# Patient Record
Sex: Female | Born: 1975 | Race: White | Hispanic: No
Health system: Midwestern US, Community
[De-identification: ages and names within clinical notes are randomized; demographics above are authoritative.]

## PROBLEM LIST (undated history)

## (undated) DIAGNOSIS — N920 Excessive and frequent menstruation with regular cycle: Secondary | ICD-10-CM

## (undated) DIAGNOSIS — R102 Pelvic and perineal pain: Secondary | ICD-10-CM

## (undated) DIAGNOSIS — Z1231 Encounter for screening mammogram for malignant neoplasm of breast: Secondary | ICD-10-CM

## (undated) DIAGNOSIS — F410 Panic disorder [episodic paroxysmal anxiety] without agoraphobia: Secondary | ICD-10-CM

## (undated) DIAGNOSIS — N938 Other specified abnormal uterine and vaginal bleeding: Secondary | ICD-10-CM

## (undated) DIAGNOSIS — N939 Abnormal uterine and vaginal bleeding, unspecified: Secondary | ICD-10-CM

---

## 2011-07-21 IMAGING — CT Head^02_SINUS_LIMITED (Adult)
2 series · 15 of 40 positions shown, 18 images · non-contrast
Comparison: none

HISTORY: Nasal obstruction, lateral nasal pain status post polypectomy.

No previous for comparison.
TECHNIQUE: Routine 3 millimeter transverse images and 2 mm coronal images through the paranasal sinuses were acquired.

[Series 4: bone supine · axial · 0.32mm/px · z∈[-194,-107]mm · 12 of 35 slices shown, 15 images]
[im 3/35  brain]
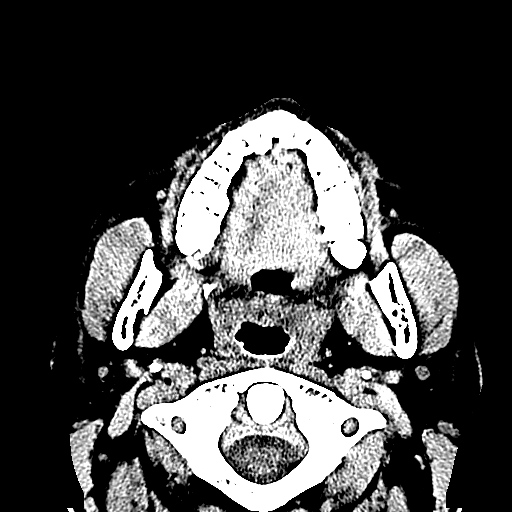
[im 3/35  bone]
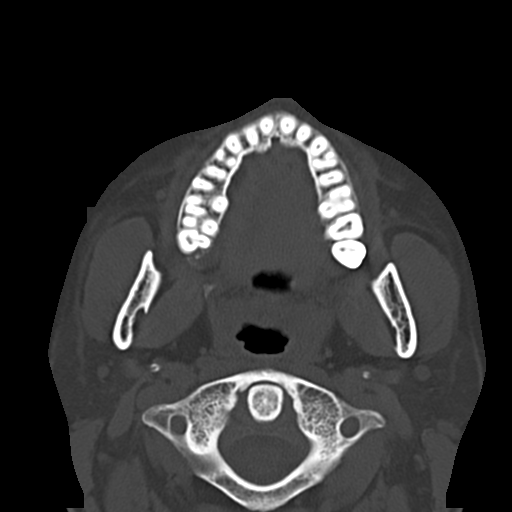
[im 5/35  bone]
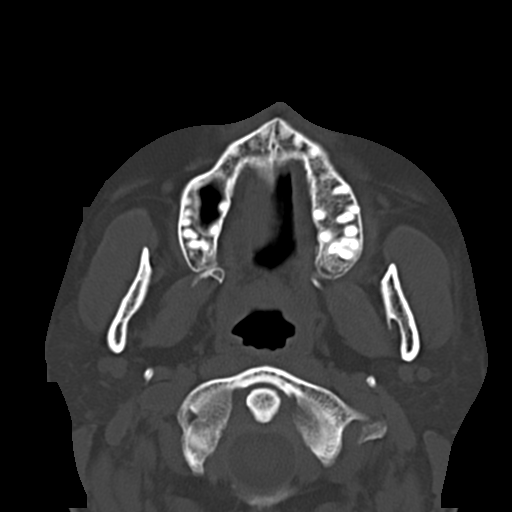
[im 8/35  bone]
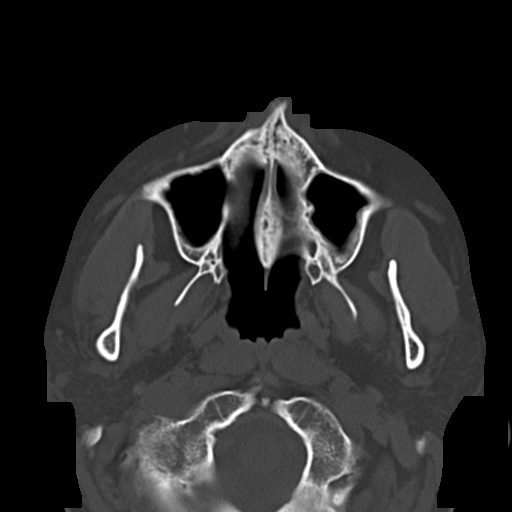
[im 11/35  bone]
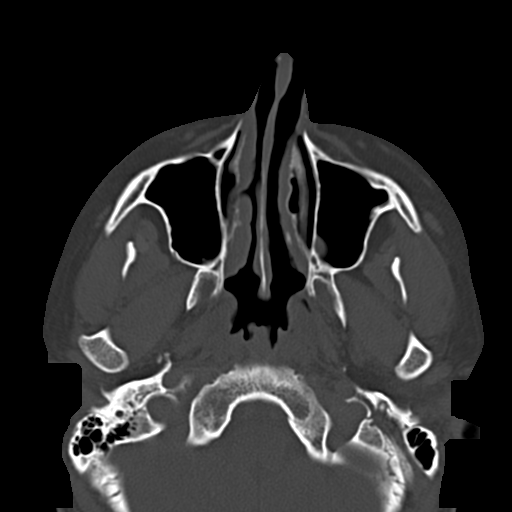
[im 13/35  brain]
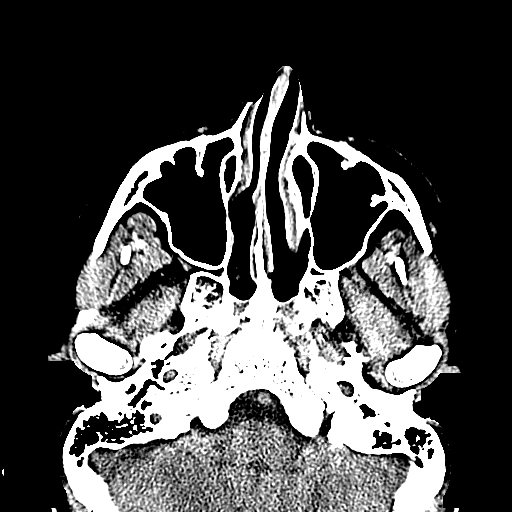
[im 13/35  bone]
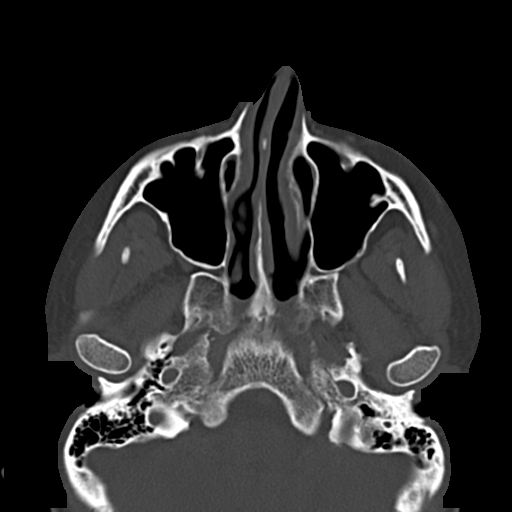
[im 16/35  bone]
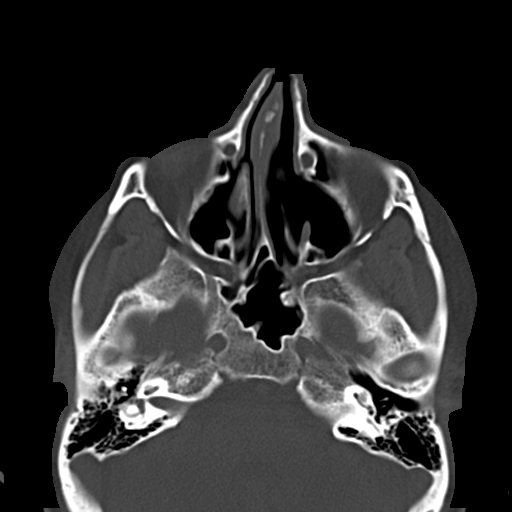
[im 19/35  bone]
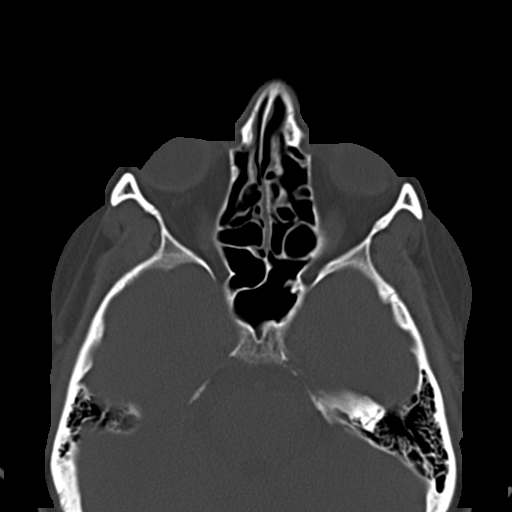
[im 22/35  bone]
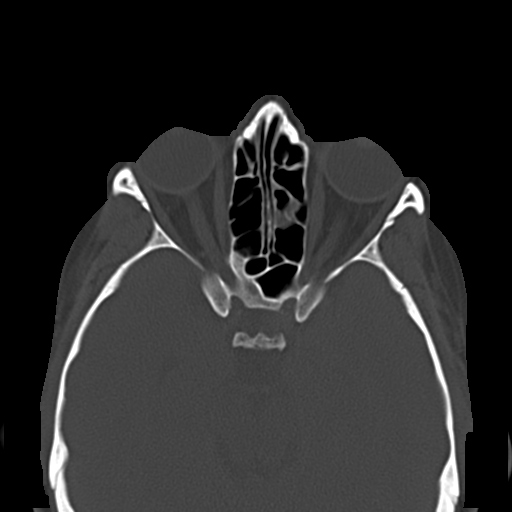
[im 24/35  brain]
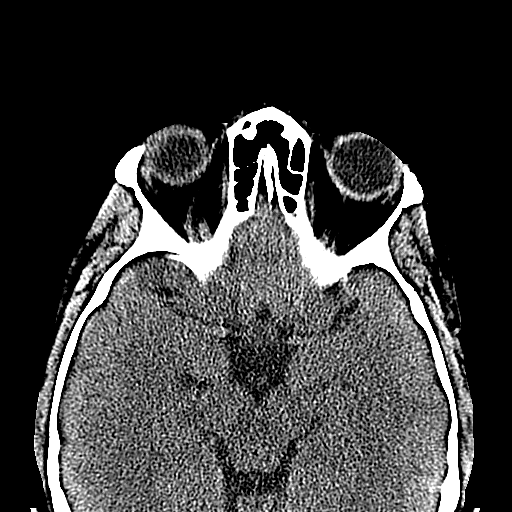
[im 24/35  bone]
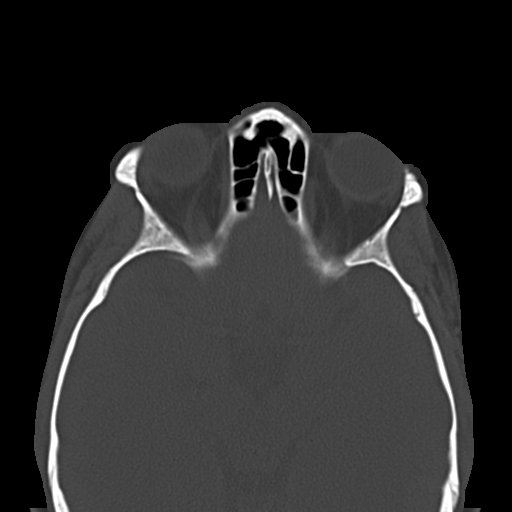
[im 27/35  bone]
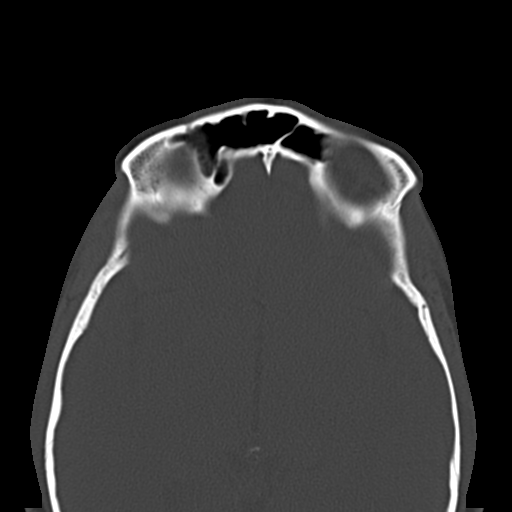
[im 30/35  bone]
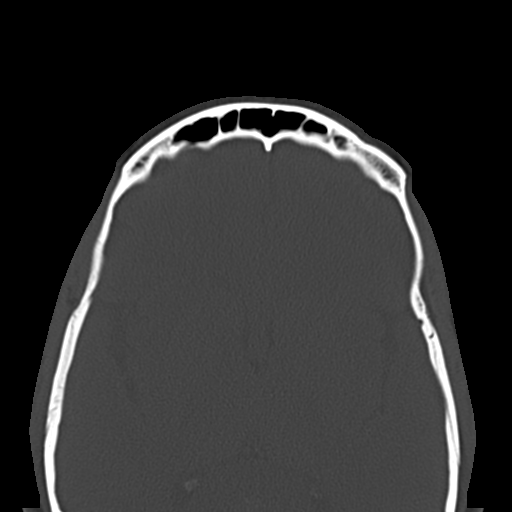
[im 32/35  bone]
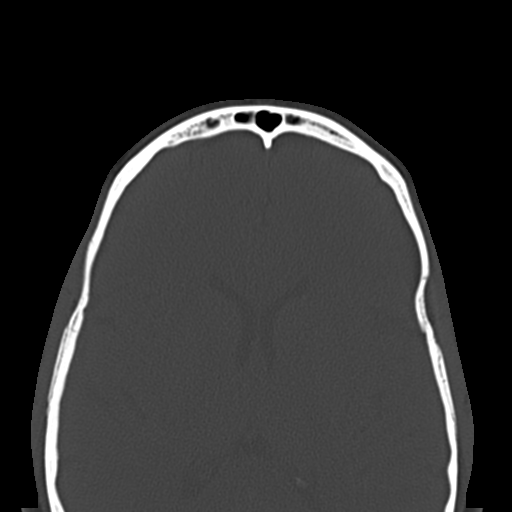

[Series 603: coronal · coronal · 0.32mm/px · 3 of 46 slices shown]
[im 16/46  bone]
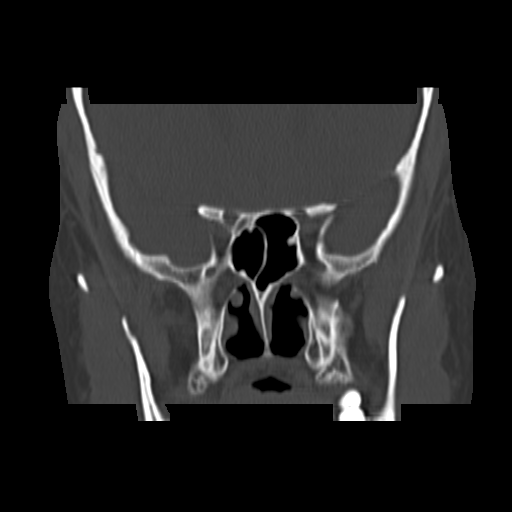
[im 21/46  bone]
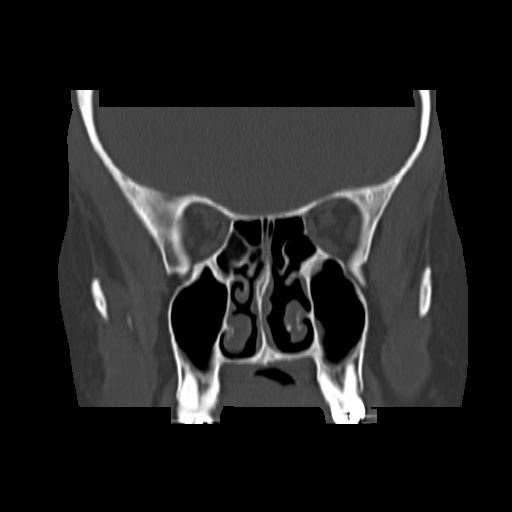
[im 26/46  bone]
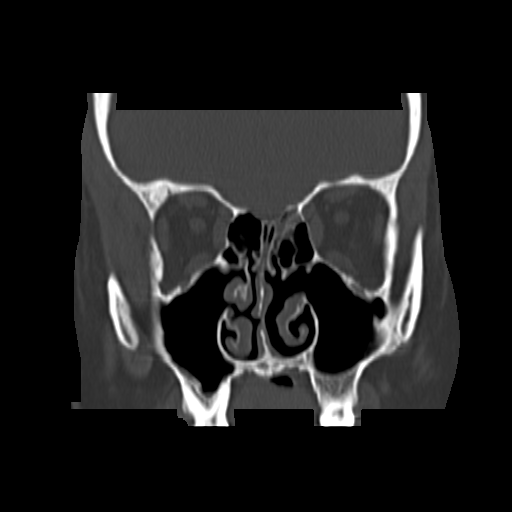

[15 of 40 positions shown; findings below may reference images not displayed]

FINDINGS: There is minor mucosal thickening present posteriorly left maxillary sinus, up to 1.7 mm.  There is also minor mucosal thickening present within the ethmoid air cells bilaterally. There are no air-fluid levels. Bilateral nasal antral windows are evident. Sphenoid sinuses and frontal sinuses are clear.

Nasal fossae demonstrate no evidence of polyposis. Resection of the left middle turbinate evident. There is mild rightward nasal septal deviation, approximately 3 mm.

Mastoid air cells and middle ear cavities are normally aerated. Osseous structures are normally mineralized without fracture or destructive lesion.
IMPRESSION: 1. Postop endoscopic sinus surgery with bilateral nasoantral windows evident. There has been partial resection of the left middle turbinate as well.

2. Minor mucosal thickening posteriorly left maxillary sinus and ethmoid air cells bilaterally consistent with mild chronic sinusitis. No air-fluid levels to suggest acute sinusitis.

3. Mild rightward nasal septal deviation.

4. No evidence of polyposis.

## 2014-06-20 NOTE — Other (Signed)
Patient verified name, DOB, and surgery as listed in [facility].     Type 1B surgery, PAT assessment complete.  Orders NOT received.    Labs per surgeon: No orders received at this time  Labs per anesthesia protocol: POC urine HCG DOS      Patient answered medical/surgical history questions at their best of ability. All prior to admission medications documented in [facility].    Patient instructed to take the following medications the day of surgery according to anesthesia guidelines with a small sip of water: none.  Hold all vitamins 7 days prior to surgery and NSAIDS 5 days prior to surgery. Medications to be held Ibuprofen    Patient instructed on the following and verbalizes understanding:  [facility], time of arrival to be called the day before by 1700  NPO after midnight including gum, mints, and ice chips  Responsible adult must drive patient to the hospital, stay during surgery, and patient will  need supervision 24 hours after anesthesia  Use HIbiclens in shower the night before surgery and on the morning of surgery  Leave all valuables(money and jewelry) at home but bring insurance card and ID on DOS  Do not wear make-up, nail polish, lotions, cologne, perfumes, powders, or oil on skin.

## 2014-06-24 ENCOUNTER — Inpatient Hospital Stay: Primary: Family Medicine

## 2014-06-25 NOTE — H&P (Signed)
[facility]                           [address]                            [place], [place] [address]                                [phone]                              HISTORY AND PHYSICAL    NAME:  April Gay, April Gay                             MR:  [id number]  LOC:  [address]            SEX:  F               ACCT:  [id number]  DOB:  Aug 28, 1975            AGE:  39              PT:  S  ADMIT:  06/27/2014          DSCH:                 MSV:        DATE OF ADMISSION: 06/27/2014.    This is a preop for surgery.    HISTORY OF PRESENT ILLNESS: April Gay is a 39 year old gravida 2, para  2 lady who is here today for her preoperative visit, she is having a  dilation and curettage, hysteroscopy with resection of uterine fibroid.  She has been seen for abnormal bleeding and noted to have a pedunculated  submucosal fibroid within the endometrium versus polyp. This measures  about 1 inch in diameter. She has had some bleeding issues, so she wishes  to have this removed.    She is otherwise in good health.    PAST SURGICAL HISTORY: C-sections x2 and had a pilonidal cyst drained.    PAST MEDICAL HISTORY: Negative.    SOCIAL HISTORY: Denies tobacco or alcohol abuse.    ALLERGIES: SHE HAS NO KNOWN DRUG ALLERGIES.    MEDICATIONS: Femcon birth control pills daily.    PHYSICAL EXAMINATION  VITAL SIGNS: The patient is afebrile, vital signs are stable.  HEAD AND NECK: Normal.  CHEST: Clear to auscultation.  HEART: Regular rate and rhythm.  ABDOMEN: Benign.  EXTREMITIES: Normal.  PELVIS: The BUS and external genitalia normal. Vagina and cervix are  normal. Pap smear is current and normal. The uterus is overall normal  size and shape as well as the ovaries.    IMPRESSION: Abnormal uterine bleeding with ultrasound showing 2.1 to 2.5  cm uterine fibroid.    PLAN: The patient to have dilation and curettage, hysteroscopy with   resection of uterine fibroid. We discussed the risks and benefits of the  surgery. Risks which include, but not limited to, infection, perforation  with excessive bleeding, which could necessitate emergent hysterectomy.                Abigail Miyamoto, MD  This is an unverified document unless signed by physician.    TID:  [id number]              DIC ID:  [id number]         DT:  06/25/2014 02:35 P  JOB:  [id number]           DOC#:  [id number]           DD:  06/25/2014     cc:  Abigail MiyamotoGlenn M Earl Zellmer, MD

## 2014-06-25 NOTE — H&P (Deleted)
[facility]                           [address]                            [place], [place] [address]                                [phone]                              HISTORY AND PHYSICAL    NAME:  April Gay, April Gay                             MR:  [id number]  LOC:  [address]            SEX:  F               ACCT:  [id number]  DOB:  04/06/1975            AGE:  39              PT:  S  ADMIT:  06/27/2014          DSCH:                 MSV:        DATE: 06/25/2014.    This is a preop for surgery.    HISTORY OF PRESENT ILLNESS: April Gay is a 39 year old gravida 2, para  2 lady who is here today for her preoperative visit, she is having a  dilation and curettage, hysteroscopy with resection of uterine fibroid.  She has been seen for abnormal bleeding and noted to have a pedunculated  submucosal fibroid within the endometrium versus polyp. This measures  about 1 inch in diameter. She has had some bleeding issues, so she wishes  to have this removed.    She is otherwise in good health.    PAST SURGICAL HISTORY: C-sections x2 and had a pilonidal cyst drained.    PAST MEDICAL HISTORY: Negative.    SOCIAL HISTORY: Denies tobacco or alcohol abuse.    ALLERGIES: SHE HAS NO KNOWN DRUG ALLERGIES.    MEDICATIONS: ________birth control pills daily.    PHYSICAL EXAMINATION  VITAL SIGNS: The patient is afebrile, vital signs are stable.  HEAD AND NECK: Normal.  CHEST: Clear to auscultation.  HEART: Regular rate and rhythm.  ABDOMEN: Benign.  EXTREMITIES: Normal.  PELVIS: The BUS and external genitalia normal. Vagina and cervix are  normal. Pap smear is current and normal. The uterus is overall normal  size and shape as well as the ovaries.    IMPRESSION: Abnormal uterine bleeding with ultrasound showing 2.1 to 2.5  cm uterine fibroid.    PLAN: The patient to have dilation and curettage, hysteroscopy with  resection of uterine fibroid. We discussed the risks and benefits of the   surgery. Risks which include, but not limited to, infection, perforation  with excessive bleeding, which could necessitate emergent hysterectomy.                Abigail Miyamoto, MD                This is an  unverified document unless signed by physician.    TID:  [id number]              DIC ID:  [id number]         DT:  06/25/2014 02:35 P  JOB:  [id number]           DOC#:  [id number]           DD:  06/25/2014     cc:  Abigail MiyamotoGlenn M Yerachmiel Spinney, MD

## 2014-06-27 ENCOUNTER — Inpatient Hospital Stay: Payer: BLUE CROSS/BLUE SHIELD

## 2014-06-27 LAB — HCG URINE, QL. - POC: Pregnancy test,urine (POC): NEGATIVE

## 2014-06-27 MED ORDER — DEXAMETHASONE SODIUM PHOSPHATE 10 MG/ML IJ SOLN
10 mg/mL | INTRAMUSCULAR | Status: AC
Start: 2014-06-27 — End: ?

## 2014-06-27 MED ORDER — PROPOFOL 10 MG/ML IV EMUL
10 mg/mL | INTRAVENOUS | Status: DC | PRN
Start: 2014-06-27 — End: 2014-06-27
  Administered 2014-06-27: 12:00:00 via INTRAVENOUS

## 2014-06-27 MED ORDER — ACETAMINOPHEN 500 MG TAB
500 mg | Freq: Four times a day (QID) | ORAL | Status: DC | PRN
Start: 2014-06-27 — End: 2014-06-27

## 2014-06-27 MED ORDER — LACTATED RINGERS IV
INTRAVENOUS | Status: DC
Start: 2014-06-27 — End: 2014-06-27
  Administered 2014-06-27 (×2): via INTRAVENOUS

## 2014-06-27 MED ORDER — FENTANYL CITRATE (PF) 50 MCG/ML IJ SOLN
50 mcg/mL | INTRAMUSCULAR | Status: AC
Start: 2014-06-27 — End: ?

## 2014-06-27 MED ORDER — HYDROCODONE-ACETAMINOPHEN 7.5 MG-325 MG TAB
ORAL_TABLET | ORAL | Status: DC
Start: 2014-06-27 — End: 2015-08-06

## 2014-06-27 MED ORDER — SODIUM CHLORIDE 0.9 % IJ SYRG
INTRAMUSCULAR | Status: DC | PRN
Start: 2014-06-27 — End: 2014-06-27

## 2014-06-27 MED ORDER — ONDANSETRON (PF) 4 MG/2 ML INJECTION
4 mg/2 mL | INTRAMUSCULAR | Status: AC
Start: 2014-06-27 — End: ?

## 2014-06-27 MED ORDER — PROPOFOL 10 MG/ML IV EMUL
10 mg/mL | INTRAVENOUS | Status: AC
Start: 2014-06-27 — End: ?

## 2014-06-27 MED ORDER — SODIUM CHLORIDE 0.9 % IJ SYRG
Freq: Three times a day (TID) | INTRAMUSCULAR | Status: DC
Start: 2014-06-27 — End: 2014-06-27

## 2014-06-27 MED ORDER — LIDOCAINE (PF) 20 MG/ML (2 %) IJ SOLN
20 mg/mL (2 %) | INTRAMUSCULAR | Status: DC | PRN
Start: 2014-06-27 — End: 2014-06-27
  Administered 2014-06-27: 12:00:00 via INTRAVENOUS

## 2014-06-27 MED ORDER — MIDAZOLAM 1 MG/ML IJ SOLN
1 mg/mL | Freq: Once | INTRAMUSCULAR | Status: AC
Start: 2014-06-27 — End: 2014-06-27
  Administered 2014-06-27: 11:00:00 via INTRAVENOUS

## 2014-06-27 MED ORDER — CEFAZOLIN 1 GRAM SOLUTION FOR INJECTION
1 gram | Freq: Once | INTRAMUSCULAR | Status: AC
Start: 2014-06-27 — End: 2014-06-27
  Administered 2014-06-27: 12:00:00 via INTRAVENOUS

## 2014-06-27 MED ORDER — HYDROMORPHONE (PF) 2 MG/ML IJ SOLN
2 mg/mL | INTRAMUSCULAR | Status: DC | PRN
Start: 2014-06-27 — End: 2014-06-27
  Administered 2014-06-27: 13:00:00 via INTRAVENOUS

## 2014-06-27 MED ORDER — EPHEDRINE SULFATE 50 MG/ML IJ SOLN
50 mg/mL | INTRAMUSCULAR | Status: DC | PRN
Start: 2014-06-27 — End: 2014-06-27
  Administered 2014-06-27: 13:00:00 via INTRAVENOUS

## 2014-06-27 MED ORDER — EPHEDRINE SULFATE 50 MG/ML IJ SOLN
50 mg/mL | INTRAMUSCULAR | Status: AC
Start: 2014-06-27 — End: ?

## 2014-06-27 MED ORDER — OXYCODONE 5 MG TAB
5 mg | ORAL | Status: DC | PRN
Start: 2014-06-27 — End: 2014-06-27

## 2014-06-27 MED ORDER — PROMETHAZINE 25 MG/ML INJECTION
25 mg/mL | INTRAMUSCULAR | Status: DC | PRN
Start: 2014-06-27 — End: 2014-06-27

## 2014-06-27 MED ORDER — DEXAMETHASONE SODIUM PHOSPHATE 10 MG/ML IJ SOLN
10 mg/mL | INTRAMUSCULAR | Status: DC | PRN
Start: 2014-06-27 — End: 2014-06-27
  Administered 2014-06-27: 12:00:00 via INTRAVENOUS

## 2014-06-27 MED ORDER — LACTATED RINGERS IV
INTRAVENOUS | Status: DC
Start: 2014-06-27 — End: 2014-06-27

## 2014-06-27 MED ORDER — FAMOTIDINE 20 MG TAB
20 mg | Freq: Once | ORAL | Status: AC
Start: 2014-06-27 — End: 2014-06-27
  Administered 2014-06-27: 11:00:00 via ORAL

## 2014-06-27 MED ORDER — SODIUM CHLORIDE 0.9 % IV
INTRAVENOUS | Status: DC
Start: 2014-06-27 — End: 2014-06-27

## 2014-06-27 MED ORDER — FENTANYL CITRATE (PF) 50 MCG/ML IJ SOLN
50 mcg/mL | INTRAMUSCULAR | Status: DC | PRN
Start: 2014-06-27 — End: 2014-06-27
  Administered 2014-06-27 (×4): via INTRAVENOUS

## 2014-06-27 MED ORDER — LIDOCAINE (PF) 20 MG/ML (2 %) IV SYRINGE
100 mg/5 mL (2 %) | INTRAVENOUS | Status: AC
Start: 2014-06-27 — End: ?

## 2014-06-27 MED ORDER — CEFAZOLIN 1 GRAM SOLUTION FOR INJECTION
1 gram | Freq: Once | INTRAMUSCULAR | Status: DC
Start: 2014-06-27 — End: 2014-06-27

## 2014-06-27 MED ORDER — LIDOCAINE HCL 1 % (10 MG/ML) IJ SOLN
10 mg/mL (1 %) | INTRAMUSCULAR | Status: DC | PRN
Start: 2014-06-27 — End: 2014-06-27
  Administered 2014-06-27: 11:00:00 via SUBCUTANEOUS

## 2014-06-27 MED ORDER — FENTANYL CITRATE (PF) 50 MCG/ML IJ SOLN
50 mcg/mL | Freq: Once | INTRAMUSCULAR | Status: DC
Start: 2014-06-27 — End: 2014-06-27

## 2014-06-27 MED FILL — PROPOFOL 10 MG/ML IV EMUL: 10 mg/mL | INTRAVENOUS | Qty: 200

## 2014-06-27 MED FILL — SODIUM CHLORIDE 0.9 % IV PIGGY BACK: INTRAVENOUS | Qty: 50

## 2014-06-27 MED FILL — FENTANYL CITRATE (PF) 50 MCG/ML IJ SOLN: 50 mcg/mL | INTRAMUSCULAR | Qty: 2

## 2014-06-27 MED FILL — MIDAZOLAM 1 MG/ML IJ SOLN: 1 mg/mL | INTRAMUSCULAR | Qty: 2

## 2014-06-27 MED FILL — LIDOCAINE (PF) 20 MG/ML (2 %) IJ SOLN: 20 mg/mL (2 %) | INTRAMUSCULAR | Qty: 100

## 2014-06-27 MED FILL — DEXAMETHASONE SODIUM PHOSPHATE 10 MG/ML IJ SOLN: 10 mg/mL | INTRAMUSCULAR | Qty: 10

## 2014-06-27 MED FILL — LIDOCAINE (PF) 20 MG/ML (2 %) IV SYRINGE: 100 mg/5 mL (2 %) | INTRAVENOUS | Qty: 5

## 2014-06-27 MED FILL — EPHEDRINE SULFATE 50 MG/ML IJ SOLN: 50 mg/mL | INTRAMUSCULAR | Qty: 10

## 2014-06-27 MED FILL — ONDANSETRON (PF) 4 MG/2 ML INJECTION: 4 mg/2 mL | INTRAMUSCULAR | Qty: 2

## 2014-06-27 MED FILL — EPHEDRINE SULFATE 50 MG/ML IJ SOLN: 50 mg/mL | INTRAMUSCULAR | Qty: 1

## 2014-06-27 MED FILL — PROPOFOL 10 MG/ML IV EMUL: 10 mg/mL | INTRAVENOUS | Qty: 20

## 2014-06-27 MED FILL — HYDROMORPHONE (PF) 2 MG/ML IJ SOLN: 2 mg/mL | INTRAMUSCULAR | Qty: 1

## 2014-06-27 MED FILL — FAMOTIDINE 20 MG TAB: 20 mg | ORAL | Qty: 1

## 2014-06-27 MED FILL — DEXAMETHASONE SODIUM PHOSPHATE 10 MG/ML IJ SOLN: 10 mg/mL | INTRAMUSCULAR | Qty: 1

## 2014-06-27 NOTE — Other (Signed)
Peri pad drainage remains unchanged.

## 2014-06-27 NOTE — Brief Op Note (Signed)
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

## 2014-06-27 NOTE — Op Note (Signed)
[facility]                           [address]                            [place], [place] [id number]                                [phone]                                OPERATIVE REPORT  ________________________________________________________________________  NAME:  April Gay, [name]                             MR:  [id number]  LOC:  PACUPACUPL            SEX:  F               ACCT:  [id number]  DOB:  02/14/1976            AGE:  39              PT:  S  ADMIT:  06/27/2014          DSCH:  06/27/2014     MSV:  ________________________________________________________________________        DATE OF SURGERY: 06/27/2014    PREOPERATIVE DIAGNOSIS: The patient with abnormal bleeding, ultrasound  confirmed approximately 1 inch in diameter uterine fibroid.    POSTOPERATIVE DIAGNOSIS    NAME OF PROCEDURE  1. Dilatation and curettage.  2. Hysteroscopic resection of uterine fibroid.    SURGEON: Shawnie DapperGlenn M. JamaicaFrench, MD.    ANESTHESIA: General.    ESTIMATED BLOOD LOSS: 10 mL.    COMPLICATIONS: None.    TISSUE: Uterine fibroid.    PROCEDURE: After informed consent, the patient was taken to the operating  room and given general anesthesia. She was prepped and draped in the  usual sterile fashion in dorsal lithotomy position. Examination under  anesthesia was carried out. The weighted speculum was placed in the  vagina. Tenaculum was used to grasp the anterior lip of the cervix. The  cervix was dilated to admit the hysteroscope. The uterine fibroid that  was known to exist due to ultrasound, was located anteriorly, somewhat  pedunculated, but also some of the fibroid did extend into the  myometrium. This was also fundal as well. It decided to use the MyoSure  for morcellation. The MyoSure hysteroscope was then placed into the  uterine cavity, pictures taken before and after morcellation. The  MyoSure was then used to gradually take the fibroid away in pieces. The   intake and output volume was constantly monitored. The fibroid was  removed as much as it could safely be removed in that the portion that  was submucosal was extracted. There still was a small amount of base  tissue that was present in the myometrium, and the pressure was dropped  and then some of this does extrude and some more of it was taken down.  The total volume of fluid loss was about 1800 mL and some of this  was unquantifiable due to leakage and was lost on the floor. This number  was artificially increased. A true measure would be  around 1500-1600 mL.  At this time, the procedure was terminated as the instruments were  removed, again, a picture was taken after. The patient was then awakened  and taken to the recovery room in stable condition. She was given  discharge instructions and given Lortab pain pills. She will come back in  the office in 1 week for followup visit.                Abigail MiyamotoGlenn M Christiana Gurevich, MD                This is an unverified document unless signed by physician.    TID:  wmx                                      DT:  06/27/2014 11:17 A  JOB:  [id number]           DOC#:  [id number]           DD:  06/27/2014    cc:   Abigail MiyamotoGlenn M Vitaly Wanat, MD

## 2014-06-27 NOTE — Anesthesia Post-Procedure Evaluation (Signed)
Post-Anesthesia Evaluation and Assessment    Patient: April PedCindy P Flood MRN: [id number]  SSN: [id number]    Date of Birth: 1975/12/15  Age: 39 y.o.  Sex: female       Cardiovascular Function/Vital Signs  Visit Vitals   Item Reading   ??? BP 123/69 mmHg   ??? Pulse 70   ??? Temp 36.7 ??C (98.1 ??F)   ??? Resp 16   ??? Ht 5\' 3"  (1.6 m)   ??? Wt 61.803 kg (136 lb 4 oz)   ??? BMI 24.14 kg/m2   ??? SpO2 98%       Patient is status post general anesthesia for Procedure(s):  DILATATION AND CURETTAGE HYSTEROSCOPY  VAGINAL MYOMECTOMY/ MYOSURE.    Nausea/Vomiting: None    Postoperative hydration reviewed and adequate.    Pain:  Pain Scale 1: Numeric (0 - 10) (06/27/14 0853)  Pain Intensity 1: 0 (06/27/14 0853)   Managed    Neurological Status:   Neuro (WDL): Within Defined Limits (06/27/14 0853)  Neuro  LUE Motor Response: Purposeful (06/27/14 0853)  LLE Motor Response: Purposeful (06/27/14 0853)  RUE Motor Response: Purposeful (06/27/14 0853)  RLE Motor Response: Purposeful (06/27/14 0853)   At baseline    Mental Status and Level of Consciousness: Alert and oriented     Pulmonary Status:   O2 Device: Room air (06/27/14 0908)   Adequate oxygenation and airway patent    Complications related to anesthesia: None    Post-anesthesia assessment completed. No concerns    Signed By: Arlis PortaENNIS E Kenton Leppla, MD     June 27, 2014

## 2014-06-27 NOTE — Other (Signed)
Peri-pad with scant amount of bloody drainage noted.

## 2014-06-27 NOTE — Anesthesia Pre-Procedure Evaluation (Addendum)
Anesthetic History   No history of anesthetic complications            Review of Systems / Medical History  Patient summary reviewed, nursing notes reviewed and pertinent labs reviewed    Pulmonary          Smoker         Neuro/Psych   Within defined limits           Cardiovascular                  Exercise tolerance: >4 METS     GI/Hepatic/Renal  Within defined limits              Endo/Other  Within defined limits           Other Findings            Physical Exam    Airway  Mallampati: II  TM Distance: 4 - 6 cm  Neck ROM: normal range of motion   Mouth opening: Normal     Cardiovascular  Regular rate and rhythm,  S1 and S2 normal,  no murmur, click, rub, or gallop  Rhythm: regular  Rate: normal         Dental    Dentition: Caps/crowns     Pulmonary  Breath sounds clear to auscultation               Abdominal         Other Findings            Anesthetic Plan    ASA: 2  Anesthesia type: general          Induction: Intravenous  Anesthetic plan and risks discussed with: Patient

## 2014-06-27 NOTE — Op Note (Signed)
[facility]                           [address]                            [place], [place] [address]                                [phone]                                OPERATIVE REPORT  ________________________________________________________________________  NAME:  April Gay, April Gay                             MR:  [id number]  LOC:  PACUPACUPL            SEX:  F               ACCT:  [id number]  DOB:  Jun 11, 1975            AGE:  39              PT:  S  ADMIT:  06/27/2014          DSCH:  06/27/2014     MSV:  ________________________________________________________________________        DATE OF SURGERY: 06/27/2014    PREOPERATIVE DIAGNOSIS: The patient with abnormal bleeding, ultrasound  confirmed approximately 1 inch in diameter uterine fibroid.    POSTOPERATIVE DIAGNOSIS    NAME OF PROCEDURE  1. Dilatation and curettage.  2. Hysteroscopic resection of uterine fibroid.    SURGEON: Shawnie Dapper. [place], MD.    ANESTHESIA: General.    ESTIMATED BLOOD LOSS: 10 mL.    COMPLICATIONS: None.    TISSUE: Uterine fibroid.    PROCEDURE: After informed consent, the patient was taken to the operating  room and given general anesthesia. She was prepped and draped in the  usual sterile fashion in dorsal lithotomy position. Examination under  anesthesia was carried out. The weighted speculum was placed in the  vagina. Tenaculum was used to grasp the anterior lip of the cervix. The  cervix was dilated to admit the hysteroscope. The uterine fibroid that  was known to exist due to ultrasound, was located anteriorly, somewhat  pedunculated, but also some of the fibroid did extend into the  myometrium. This was also fundal as well. It decided to use the MyoSure  for morcellation. The MyoSure hysteroscope was then placed into the  uterine cavity, pictures taken before and after morcellation. The  MyoSure was then used to gradually take the fibroid away in pieces. The  intake and output  volume was constantly monitored. The fibroid was  removed as much as it could safely be removed in that the portion that  was submucosal was extracted. There still was a small amount of base  tissue that was present in the myometrium, and the pressure was dropped  and then some of this does extrude and some more of it was taken down.  The total volume of fluid loss was about 1800 mL and some of this  was unquantifiable due to leakage and was lost on the floor. This number  was artificially increased. A true measure would be  around 1500-1600 mL.  At this time, the procedure was terminated as the instruments were  removed, again, a picture was taken after. The patient was then awakened  and taken to the recovery room in stable condition. She was given  discharge instructions and given Lortab pain pills. She will come back in  the office in 1 week for followup visit.                Abigail MiyamotoGlenn M Christiana Gurevich, MD                This is an unverified document unless signed by physician.    TID:  wmx                                      DT:  06/27/2014 11:17 A  JOB:  [id number]           DOC#:  [id number]           DD:  06/27/2014    cc:   Abigail MiyamotoGlenn M Vitaly Wanat, MD

## 2015-04-06 ENCOUNTER — Encounter

## 2015-04-07 ENCOUNTER — Ambulatory Visit
Admit: 2015-04-07 | Discharge: 2015-04-07 | Payer: BLUE CROSS/BLUE SHIELD | Attending: Obstetrics & Gynecology | Primary: Family Medicine

## 2015-04-07 DIAGNOSIS — Z124 Encounter for screening for malignant neoplasm of cervix: Secondary | ICD-10-CM

## 2015-04-07 NOTE — Progress Notes (Signed)
Pt came in today for repeat pap but we discussed other things such as dyspareunia and AUB. States she avoids intercourse due to pain and also bleeding gets in the way. Bleeds 7-10 days, 24-26 day cycle. She is already on BCPs, these are not adequate. They do not want any more children. Exam shows abd benign, BUS/EG wnl . Vagina and cervix wnl, pap done. Uterus definitely 8 weeks with anterior fibroid/adenoma, tender on exam. Pt having dysmenorrhea so perhaps has endometriosis. Will cal  With pap results. Discussed hysterectomy for permanent birth control, AUB, pain and relief of abnormal paps. 15-20 minutes.

## 2015-04-12 LAB — PAP IG,CT-NG, APTIMA HPV AND RFX 16/18,45 (183194,507805)
.: 0
Chlamydia, Nuc. Acid Amp.: NEGATIVE
Gonococcus, Nuc. Acid Amp.: NEGATIVE
HPV APTIMA: NEGATIVE

## 2015-04-12 LAB — PAP IG,CT-NG, APTIMA HPV AND RFX 16/18,45 (199310)
CHLAMYDIA, NUC. ACID AMP, 186114: NEGATIVE
GONOCOCCUS, NUC. ACID AMP, 186122: NEGATIVE
HPV Aptima: NEGATIVE
LABCORP 019018: 0

## 2015-05-20 ENCOUNTER — Inpatient Hospital Stay: Admit: 2015-05-20 | Payer: BLUE CROSS/BLUE SHIELD | Attending: Obstetrics & Gynecology | Primary: Family Medicine

## 2015-05-20 DIAGNOSIS — Z1231 Encounter for screening mammogram for malignant neoplasm of breast: Secondary | ICD-10-CM

## 2015-08-06 ENCOUNTER — Ambulatory Visit
Admit: 2015-08-06 | Discharge: 2015-08-06 | Payer: BLUE CROSS/BLUE SHIELD | Attending: Obstetrics & Gynecology | Primary: Family Medicine

## 2015-08-06 DIAGNOSIS — Z01419 Encounter for gynecological examination (general) (routine) without abnormal findings: Secondary | ICD-10-CM

## 2015-08-06 LAB — AMB POC URINALYSIS DIP STICK AUTO W/O MICRO
Bilirubin (UA POC): NEGATIVE
Blood (UA POC): NEGATIVE
Glucose (UA POC): NEGATIVE
Ketones (UA POC): NEGATIVE
Leukocyte esterase (UA POC): NEGATIVE
Nitrites (UA POC): NEGATIVE
Protein (UA POC): NEGATIVE mg/dL
Specific gravity (UA POC): 1.015 (ref 1.001–1.035)
Urobilinogen (UA POC): 0.2 (ref 0.2–1)
pH (UA POC): 5.5 (ref 4.6–8.0)

## 2015-08-06 LAB — AMB POC TOTAL HEMOGLOBIN (SPHB): Total Hemoglobin (SpHb)(POC): 13.4 g/dL

## 2015-08-06 MED ORDER — NORETHIN-ETHINYL ESTRADIOL-IRON 0.4 MG-35 MCG(21)/75 MG(7) CHEW TABLET
PACK | Freq: Every day | ORAL | 13 refills | Status: DC
Start: 2015-08-06 — End: 2016-08-09

## 2015-08-06 NOTE — Progress Notes (Signed)
HISTORY OF PRESENT ILLNESS  April Gay is a 40 y.o. female.  Well Woman   The history is provided by the patient. Pertinent negatives include no chest pain and no headaches.       Review of Systems   Constitutional: Negative.  Negative for chills, fever, malaise/fatigue and weight loss.   HENT: Negative for congestion, ear discharge and sore throat.    Respiratory: Negative.    Cardiovascular: Negative for chest pain, palpitations and leg swelling.   Gastrointestinal: Negative.    Genitourinary: Negative.    Musculoskeletal: Negative for back pain and myalgias.   Skin: Negative.  Negative for itching and rash.   Neurological: Negative for dizziness, speech change, focal weakness, weakness and headaches.   Endo/Heme/Allergies: Does not bruise/bleed easily.   Psychiatric/Behavioral: Negative for depression and memory loss. The patient does not have insomnia.        Physical Exam   Constitutional: She is oriented to person, place, and time. She appears well-developed and well-nourished.   HENT:   Head: Normocephalic.   Mouth/Throat: No oropharyngeal exudate.   Eyes: Right eye exhibits no discharge. Left eye exhibits no discharge.   Neck: Normal range of motion. Neck supple. No tracheal deviation present. No thyromegaly present.   Cardiovascular: Normal rate, regular rhythm, normal heart sounds and intact distal pulses.  Exam reveals no gallop and no friction rub.    No murmur heard.  Pulmonary/Chest: Effort normal and breath sounds normal. No respiratory distress. She has no wheezes. She has no rales.   Abdominal: She exhibits no distension and no mass. There is no tenderness. There is no rebound and no guarding.   Genitourinary: Vagina normal and uterus normal. Rectal exam shows no external hemorrhoid, no internal hemorrhoid, no fissure, no mass, no tenderness, anal tone normal and guaiac negative stool. No breast swelling, tenderness, discharge or bleeding. There is no rash, tenderness,  lesion or injury on the right labia. There is no rash, tenderness, lesion or injury on the left labia. Uterus is not deviated, not enlarged and not tender. Cervix exhibits no motion tenderness and no discharge. Right adnexum displays no mass, no tenderness and no fullness. Left adnexum displays no tenderness and no fullness. No bleeding in the vagina. No foreign body in the vagina. No vaginal discharge found.   Genitourinary Comments: No pap due   Musculoskeletal: Normal range of motion. She exhibits no edema or deformity.   Lymphadenopathy:     She has no cervical adenopathy.   Neurological: She is alert and oriented to person, place, and time. No cranial nerve deficit. Coordination normal.   Skin: Skin is warm and dry. No rash noted. No erythema. No pallor.   Psychiatric: She has a normal mood and affect. Her behavior is normal. Judgment and thought content normal.   refill for BCPs, mammo order    ASSESSMENT and PLAN  Orders Placed This Encounter   ??? Bilateral (Routine screening, yearly, no symptoms)   ??? Transcutaneous HgB ([address])   ??? Urinalysis, Auto w/o Micro (81003)   ??? DISCONTD: Noreth-Ethinyl Estradiol-Iron (ZENCHENT FE) 0.4mg -86mcg(21) and 75 mg (7) chew   ??? Noreth-Ethinyl Estradiol-Iron (ZENCHENT FE) 0.4mg -71mcg(21) and 75 mg (7) chew     Orders Placed This Encounter   ??? Bilateral (Routine screening, yearly, no symptoms)     Standing Status:   Future     Standing Expiration Date:   09/05/2016     Order Specific Question:   Reason for Exam  Answer:   Screening Mammogram     Order Specific Question:   Is Patient Pregnant?     Answer:   No   ??? Transcutaneous HgB ([address](887[address])   ??? Urinalysis, Auto w/o Micro (81003)   ??? Noreth-Ethinyl Estradiol-Iron (ZENCHENT FE) 0.4mg -7635mcg(21) and 75 mg (7) chew     Sig: Take 1 Tab by mouth daily. Indications: DYSMENORRHEA     Dispense:  1 Package     Refill:  13     Orders Placed This Encounter   ??? Bilateral (Routine screening, yearly, no symptoms)    ??? Transcutaneous HgB (60454(88738)   ??? Urinalysis, Auto w/o Micro (81003)   ??? Noreth-Ethinyl Estradiol-Iron (ZENCHENT FE) 0.4mg -4535mcg(21) and 75 mg (7) chew     [name] was seen today for well woman.    Diagnoses and all orders for this visit:    Well woman exam  -     Transcutaneous HgB (09811(88738)  -     Urinalysis, Auto w/o Micro (81003)    Screening for iron deficiency anemia  -     Transcutaneous HgB (91478(88738)    Screening for genitourinary condition  -     Urinalysis, Auto w/o Micro (81003)    Visit for screening mammogram  -     Bilateral (Routine screening, yearly, no symptoms); Future    Other orders  -     Noreth-Ethinyl Estradiol-Iron (ZENCHENT FE) 0.4mg -435mcg(21) and 75 mg (7) chew; Take 1 Tab by mouth daily. Indications: DYSMENORRHEA      current treatment plan is effective, no change in therapy

## 2015-11-19 ENCOUNTER — Ambulatory Visit
Admit: 2015-11-19 | Discharge: 2015-11-19 | Payer: BLUE CROSS/BLUE SHIELD | Attending: Family Medicine | Primary: Family Medicine

## 2015-11-19 DIAGNOSIS — F418 Other specified anxiety disorders: Secondary | ICD-10-CM

## 2015-11-19 MED ORDER — ALPRAZOLAM 0.5 MG TAB
0.5 mg | ORAL_TABLET | Freq: Three times a day (TID) | ORAL | 1 refills | Status: DC | PRN
Start: 2015-11-19 — End: 2016-01-19

## 2015-11-19 MED ORDER — ESCITALOPRAM 10 MG TAB
10 mg | ORAL_TABLET | Freq: Every day | ORAL | 1 refills | Status: DC
Start: 2015-11-19 — End: 2016-01-19

## 2015-11-19 NOTE — Assessment & Plan Note (Signed)
Patient is here to establish care as a new patient with family medicine provider as patient's last primary care physician specialized in internal medicine.  Not Stable: discussed with pt various Tx options including different classes of medications &/or counseling. Will start with Lexapro 10 mg every day & referral to Psychology for counseling placed. Will f/u in 2 months.

## 2015-11-19 NOTE — Assessment & Plan Note (Signed)
Between 3 and 10 minutes spent advising patient regarding the deleterious effects of tobacco use including increased risk of: stroke, heart attack, high blood pressure, cancer, etc. Patient voices understanding of risks and acknowledges it would be better if the patient avoids further tobacco exposure. Discussed with patient possible options to help with tobacco cessation including: nicotine gum/patches/vapor and medication options.

## 2015-11-19 NOTE — Assessment & Plan Note (Signed)
Problem not addressed today but diagnosis added to order future lab.

## 2015-11-19 NOTE — Assessment & Plan Note (Signed)
Patient is here to establish care as a new patient with family medicine provider as patient's last primary care physician specialized in internal medicine.  Not Stable: providing low dose of Xanax to be used PRN and also at bedtime to help with insomnia as stress/anxiety is likely the main cause of this new issue

## 2015-11-19 NOTE — Patient Instructions (Signed)
Continue taking all the medications on this list as directed; this list is current and please discontinue any medications not on this list. Please call the office or use "My Chart" online to request medication refills prior to running out.  It was a pleasure to see and care for you this visit; I look forward to seeing you next time.

## 2015-11-19 NOTE — Progress Notes (Signed)
Kermit Balo MD  [facility]  [address]  [place], [place] [address]  [phone]  Visit Date  11/19/2015    Patient Info  April Gay  40 y.o.female  DOB: 1975-05-09  MRN: [id number]    Subjective    Chief Complaint   Patient presents with   ??? Establish Care   ??? Anxiety     C/o anxiety, stress at work and personal issues at home x 1 mo   ??? Fatigue     C/o fatiuge x mo's       HPI 40 year old Caucasian female here to establish care as new patient.  Patient has no chronic health problems she is aware of but states that she is here for concern of worsening symptoms of depression with anxiety and frequent panic attacks been progressively worsening for the past 6+ months especially over the last month.  Patient attributes these new developments to worsening problems with stress at work as well as significant stress at home as her husband has been unable to work due to accident which injured his lower back.  Her symptoms include: Depressed mood most days, anhedonia, insomnia, fatigue, irritability, perseverating thoughts, and recurrent panic attacks on an almost daily basis.    Review of Systems   Constitutional: Positive for fatigue. Negative for fever.   HENT: Negative for congestion and sore throat.    Eyes: Negative for pain and visual disturbance.   Respiratory: Negative for cough and shortness of breath.    Cardiovascular: Negative for chest pain and palpitations.   Gastrointestinal: Negative for abdominal pain, constipation, diarrhea and nausea.   Endocrine: Negative for polydipsia and polyphagia.   Genitourinary: Negative for difficulty urinating and menstrual problem.   Musculoskeletal: Negative for arthralgias and myalgias.   Skin: Negative for color change and rash.   Allergic/Immunologic: Negative for environmental allergies and food allergies.   Neurological: Negative for weakness and headaches.   Hematological: Does not bruise/bleed easily.    Psychiatric/Behavioral: Positive for agitation, decreased concentration, dysphoric mood and sleep disturbance. Negative for confusion, hallucinations and suicidal ideas. The patient is nervous/anxious.        Objective    Vitals:    11/19/15 1041   BP: 112/72   Pulse: 82   SpO2: 98%   Weight: 119 lb (54 kg)   Height: 5\' 3"  (1.6 m)     BP Readings from Last 3 Encounters:   11/19/15 112/72   08/06/15 100/60   04/07/15 102/68     Body mass index is 21.08 kg/(m^2).    Wt Readings from Last 3 Encounters:   11/19/15 119 lb (54 kg)   08/06/15 125 lb (56.7 kg)   04/07/15 120 lb (54.4 kg)       Physical Exam   Constitutional: She appears well-developed and well-nourished.   HENT:   Head: Normocephalic and atraumatic.   Eyes: Conjunctivae and EOM are normal.   Neck: Normal range of motion and phonation normal.   Cardiovascular: Normal rate, regular rhythm, normal heart sounds and intact distal pulses.    Pulmonary/Chest: Effort normal and breath sounds normal. No respiratory distress.   Abdominal: Soft. Normal appearance.   Musculoskeletal: She exhibits no edema or deformity.   Neurological: She is alert. She displays no tremor. Coordination and gait normal.   Skin: Skin is dry. No rash noted.   Psychiatric: Her behavior is normal. Her mood appears anxious.   Vitals reviewed.      Assessment/Plan    Problem  List Items Addressed This Visit     Tobacco use disorder      Between 3 and 10 minutes spent advising patient regarding the deleterious effects of tobacco use including increased risk of: stroke, heart attack, high blood pressure, cancer, etc. Patient voices understanding of risks and acknowledges it would be better if the patient avoids further tobacco exposure. Discussed with patient possible options to help with tobacco cessation including: nicotine gum/patches/vapor and medication options.               Relevant Medications    escitalopram oxalate (LEXAPRO) 10 mg tablet    ALPRAZolam (XANAX) 0.5 mg tablet     Other Relevant Orders    PR SMOKING AND TOBACCO USE CESSATION 3 - 10 MINUTES    AMB POC COMPLETE CBC,AUTOMATED ENTER    Panic attacks      Patient is here to establish care as a new patient with family medicine provider as patient's last primary care physician specialized in internal medicine.  Not Stable: providing low dose of Xanax to be used PRN and also at bedtime to help with insomnia as stress/anxiety is likely the main cause of this new issue             Relevant Orders    REFERRAL TO PSYCHOLOGY    Depression with anxiety - Primary      Patient is here to establish care as a new patient with family medicine provider as patient's last primary care physician specialized in internal medicine.  Not Stable: discussed with pt various Tx options including different classes of medications &/or counseling. Will start with Lexapro 10 mg every day & referral to Psychology for counseling placed. Will f/u in 2 months.             Relevant Medications    escitalopram oxalate (LEXAPRO) 10 mg tablet    ALPRAZolam (XANAX) 0.5 mg tablet    Other Relevant Orders    REFERRAL TO PSYCHOLOGY    Annual physical exam      Problem not addressed today but diagnosis added to order future lab.               Relevant Orders    AMB POC COMPLETE CBC,AUTOMATED ENTER    AMB POC GLUCOSE, QUANTITATIVE, BLOOD    LIPID PANEL          The following portions of the patient's history were reviewed and updated as appropriate: problem list, current medications, past medical history, past surgical history, past social history, family history, and allergies.    Patient Active Problem List   Diagnosis Code   ??? Depression with anxiety F41.8   ??? Panic attacks F41.0   ??? Tobacco use disorder F17.200   ??? Annual physical exam Z00.00       Current Outpatient Prescriptions:   ???  escitalopram oxalate (LEXAPRO) 10 mg tablet, Take 1 Tab by mouth daily., Disp: 92 Tab, Rfl: 1  ???  ALPRAZolam (XANAX) 0.5 mg tablet, Take 0.5-2 Tabs by mouth three (3)  times daily as needed for Anxiety. Max Daily Amount: 3 mg., Disp: 180 Tab, Rfl: 1  ???  Noreth-Ethinyl Estradiol-Iron (ZENCHENT FE) 0.4mg -13mcg(21) and 75 mg (7) chew, Take 1 Tab by mouth daily. Indications: DYSMENORRHEA, Disp: 1 Package, Rfl: 13  ???  IBUPROFEN (MOTRIN PO), Take  by mouth as needed., Disp: , Rfl:     Past Medical History:   Diagnosis Date   ??? Former smoker    ??? Menorrhagia  with irregular cycle      Past Surgical History:   Procedure Laterality Date   ??? HX CESAREAN SECTION      x2   ??? HX CYST REMOVAL     ??? HX WISDOM TEETH EXTRACTION       Social History     Social History   ??? Marital status: MARRIED     Spouse name: N/A   ??? Number of children: N/A   ??? Years of education: N/A     Occupational History   ??? Not on file.     Social History Main Topics   ??? Smoking status: Current Every Day Smoker     Start date: 05/19/2015   ??? Smokeless tobacco: Never Used      Comment: quit 1 year ago    ??? Alcohol use Yes      Comment: socially    ??? Drug use: No   ??? Sexual activity: Yes     Birth control/ protection: Pill     Other Topics Concern   ??? Not on file     Social History Narrative     Family History   Problem Relation Age of Onset   ??? Depression Mother    ??? No Known Problems Son    ??? No Known Problems Daughter    ??? Breast Cancer Neg Hx      No Known Allergies    This note will not be viewable in MyChart.

## 2016-01-13 ENCOUNTER — Other Ambulatory Visit: Admit: 2016-01-13 | Discharge: 2016-01-13 | Payer: BLUE CROSS/BLUE SHIELD | Primary: Family Medicine

## 2016-01-13 DIAGNOSIS — F17200 Nicotine dependence, unspecified, uncomplicated: Secondary | ICD-10-CM

## 2016-01-13 LAB — AMB POC COMPLETE CBC,AUTOMATED ENTER
ABS. GRANS (POC): 3.4 10*3/uL (ref 1.5–6.8)
ABS. LYMPHS (POC): 1.2 10*3/uL (ref 1.2–3.2)
ABS. MONOS (POC): 0.1 10*3/uL — AB (ref 0.3–0.8)
GRANULOCYTES (POC): 70.6 % (ref 43.0–76.0)
HCT (POC): 41 % (ref 35–50)
HGB (POC): 13.9 g/dL (ref 11–16.5)
LYMPHOCYTES (POC): 26.6 % (ref 17.0–48.0)
MCH (POC): 31.5 pg (ref 26.5–33.5)
MCHC (POC): 33.9 g/dL (ref 31.5–35)
MCV (POC): 93 fL (ref 80–97)
MONOCYTES (POC): 2.8 % — AB (ref 4.0–10.0)
MPV (POC): 6.7 fL (ref 6.5–11.0)
PLATELET (POC): 177 10*3/uL (ref 150–450)
RBC (POC): 4.41 10*6/uL (ref 3.8–5.8)
RDW (POC): 13.5 % (ref 10.0–15.0)
WBC (POC): 4.7 10*3/uL (ref 3.5–10)

## 2016-01-13 LAB — AMB POC GLUCOSE, QUANTITATIVE, BLOOD: Glucose POC: 88 mg/dL

## 2016-01-14 LAB — LIPID PANEL
Cholesterol, total: 190 mg/dL (ref 100–199)
HDL Cholesterol: 77 mg/dL (ref >39)
LDL, calculated: 86 mg/dL (ref 0–99)
Triglyceride: 134 mg/dL (ref 0–149)
VLDL, calculated: 27 mg/dL (ref 5–40)

## 2016-01-19 ENCOUNTER — Ambulatory Visit
Admit: 2016-01-19 | Discharge: 2016-01-19 | Payer: BLUE CROSS/BLUE SHIELD | Attending: Family Medicine | Primary: Family Medicine

## 2016-01-19 DIAGNOSIS — Z0000 Encounter for general adult medical examination without abnormal findings: Secondary | ICD-10-CM

## 2016-01-19 MED ORDER — ALPRAZOLAM 0.5 MG TAB
0.5 mg | ORAL_TABLET | Freq: Four times a day (QID) | ORAL | 2 refills | Status: DC | PRN
Start: 2016-01-19 — End: 2016-07-21

## 2016-01-19 MED ORDER — ESCITALOPRAM 20 MG TAB
20 mg | ORAL_TABLET | Freq: Every day | ORAL | 1 refills | Status: DC
Start: 2016-01-19 — End: 2016-07-21

## 2016-01-19 NOTE — Addendum Note (Signed)
Addended by: Kem KaysJENSEN, Mazella Deen K on: 01/19/2016 11:06 AM      Modules accepted: Orders

## 2016-01-19 NOTE — Progress Notes (Signed)
Kermit Balo MD  [facility]  [address]  [place], [place] [address]  [phone]  Visit Date  01/19/2016    Patient Info  April Gay  40 y.o.female  DOB: 1975/05/13  MRN: [id number]    Subjective    Chief Complaint   Patient presents with   ??? Depression     F/U, said she is going through a divorce and her husband is incarcerated   ??? Physical     40 y.o. female presents for an evaluation of chronic health conditions, lab work and/or review, medication prescriptions and/or refills, and annual physical.     Vaccinations (most recent date)  Pneumovax: Would like to discuss; giving today  Prevnar: No  Influenza (Yearly): Declines will get through employer  Tetanus/Pertussis: Will get today  Shingles: Never    Cancer Screening  Colonoscopy (Every 10 years): Never  Mammogram (Yearly): 5/17, normal results  Pap/HPV Status (Every 3-5 years): Last pap 1/7 w/Dr. [facility], normal results.  HPV - Never    Disease Screening  Cholesterol (Every 5 years): 01/13/16  Diabetes (Yearly): 01/13/16  Glaucoma/Retinopathy (Yearly): Never  Smoking (Yearly): Current Smoker  Alcohol Use (Yearly): Yes  Illicit Drug Use (Yearly): No  STD's (Yearly): Never  Bone Density (Every 2 years): No    High Risk  EKG (Once): Never  Hepatitis B Vaccine: Yes  HIV Screening (Yearly): Unknown    No flowsheet data found.  Other Providers Seen  Patient Care Team:  Renaldo Fiddler, MD as PCP - General (Family Practice)  Abigail Miyamoto, MD (Obstetrics & Gynecology)    Risk Factors  Risk factors (depression, fall risk, smoking, poor nutrition, physical inactivity, obesity, etc) were identified and appropriate referrals were made as below. If no referrals were made then no risk factors as listed were identified or there is already a treatment plan in place to address said risk factors.        HPI     Patient is here to follow up on medical problems including:  1. Annual physical exam    2. Panic attacks    3. Depression with anxiety     4. Tobacco use disorder    5. Acute nasopharyngitis    Patient states that the chronic health problems are stable and well controlled on current regimens and has no acute complaints or concerns today with the exception of continued issues with anxiety & frequent panic attacks.   Since patient was last seen several months ago her husband has been incarcerated on charges of child pornography and she is currently in the process of divorce proceedings.  This of course has increased her level of stress at home significantly she continues to have issues with high levels of stress at work.  Patient has 2 children, 52 and 46 years of age who are also struggling with this new information.  She continues to use Xanax 3-4 times daily for abatement of panic attacks.    Also having some recent URI Sx for the past few days; her daughter has been having more severe cold Sx for the past 5 days.     Review of Systems   Constitutional: Positive for fatigue. Negative for fever.   HENT: Positive for congestion, postnasal drip and rhinorrhea. Negative for sore throat.    Eyes: Negative for pain and visual disturbance.   Respiratory: Negative for cough and shortness of breath.    Cardiovascular: Negative for chest pain and palpitations.   Gastrointestinal:  Negative for abdominal pain, constipation, diarrhea and nausea.   Endocrine: Negative for polydipsia and polyphagia.   Genitourinary: Negative for difficulty urinating and menstrual problem.   Musculoskeletal: Negative for arthralgias and myalgias.   Skin: Negative for color change and rash.   Allergic/Immunologic: Negative for environmental allergies and food allergies.   Neurological: Negative for weakness and headaches.   Hematological: Does not bruise/bleed easily.   Psychiatric/Behavioral: Positive for dysphoric mood. Negative for sleep disturbance. The patient is nervous/anxious.        Objective    Vitals:    01/19/16 0954   BP: 116/76   Pulse: 81   SpO2: 97%    Weight: 116 lb 8 oz (52.8 kg)   Height: 5\' 3"  (1.6 m)     BP Readings from Last 3 Encounters:   01/19/16 116/76   11/19/15 112/72   08/06/15 100/60     Body mass index is 20.64 kg/(m^2).    Wt Readings from Last 3 Encounters:   01/19/16 116 lb 8 oz (52.8 kg)   11/19/15 119 lb (54 kg)   08/06/15 125 lb (56.7 kg)       Physical Exam   Constitutional: She appears well-developed and well-nourished.   HENT:   Head: Normocephalic and atraumatic.   Nose: Mucosal edema and rhinorrhea present. Right sinus exhibits no maxillary sinus tenderness and no frontal sinus tenderness. Left sinus exhibits no maxillary sinus tenderness and no frontal sinus tenderness.   Mouth/Throat: Posterior oropharyngeal edema and posterior oropharyngeal erythema present.   PND   Eyes: Conjunctivae and EOM are normal.   Neck: Normal range of motion and phonation normal.   Cardiovascular: Normal rate, regular rhythm, normal heart sounds and intact distal pulses.    Pulmonary/Chest: Effort normal and breath sounds normal. No respiratory distress.   Abdominal: Soft. Normal appearance and bowel sounds are normal.   Musculoskeletal: She exhibits no edema or deformity.   Lymphadenopathy:     She has cervical adenopathy.   Neurological: She is alert. She displays no tremor. Coordination and gait normal.   Skin: Skin is dry. No rash noted.   Psychiatric: She has a normal mood and affect. Her behavior is normal.   Vitals reviewed.      Assessment/Plan    Problem List Items Addressed This Visit     Annual physical exam - Primary      Discussed ideal body weight and encouraged regular physical activity and healthy diet. Recommended routine preventative measures such as always wearing seatbelt, home safety measures such as improved traction for bathroom floor, and firearm safety. Counseled the patient regarding the rationale for the recommended immunizations and screenings and they are current and/or updated.             Depression with anxiety       Problem and/or symptoms are currently not stable and/or worsening on current treatment plan. Will have patient follow up as directed and make the following changes for further evaluation and/or treatment:   Increasing Lexapro to 20 mg daily & continue with Xanax PRN for abatement of panic attacks. Encouraged pt to continue with regular counseling.             Relevant Medications    escitalopram oxalate (LEXAPRO) 20 mg tablet    ALPRAZolam (XANAX) 0.5 mg tablet    Panic attacks      Problem and/or symptoms are currently not stable and/or worsening on current treatment plan. Will have patient follow up as directed and  make the following changes for further evaluation and/or treatment:   See Tx plan for Dep w/Anxiety             Relevant Medications    ALPRAZolam (XANAX) 0.5 mg tablet    Tobacco use disorder      Between 3 and 10 minutes spent advising patient regarding the deleterious effects of tobacco use including increased risk of: stroke, heart attack, high blood pressure, cancer, etc. Patient voices understanding of risks and acknowledges it would be better if the patient avoids further tobacco exposure. Discussed with patient possible options to help with tobacco cessation including: nicotine gum/patches/vapor and medication options.  Patient plans to begin vaping as cigarette substitute.              Relevant Medications    escitalopram oxalate (LEXAPRO) 20 mg tablet    ALPRAZolam (XANAX) 0.5 mg tablet    Other Relevant Orders    PR SMOKING AND TOBACCO USE CESSATION 3 - 10 MINUTES      Other Visit Diagnoses     Acute nasopharyngitis        New Problem: advised conservative Tx w/OTC meds & rest. F/U PRN if Sx not improving over next 7-10 days          Orders Placed This Encounter   ??? PR SMOKING AND TOBACCO USE CESSATION 3 - 10 MINUTES   ??? escitalopram oxalate (LEXAPRO) 20 mg tablet     Sig: Take 1 Tab by mouth daily.     Dispense:  92 Tab     Refill:  1   ??? ALPRAZolam (XANAX) 0.5 mg tablet      Sig: Take 1 Tab by mouth four (4) times daily as needed for Anxiety. Max Daily Amount: 2 mg.     Dispense:  120 Tab     Refill:  2       The following portions of the patient's history were reviewed and updated as appropriate: problem list, current medications, past medical history, past surgical history, past social history, family history, and allergies.    Patient Active Problem List   Diagnosis Code   ??? Depression with anxiety F41.8   ??? Panic attacks F41.0   ??? Tobacco use disorder F17.200   ??? Annual physical exam Z00.00     Current Outpatient Prescriptions   Medication Sig   ??? escitalopram oxalate (LEXAPRO) 20 mg tablet Take 1 Tab by mouth daily.   ??? ALPRAZolam (XANAX) 0.5 mg tablet Take 1 Tab by mouth four (4) times daily as needed for Anxiety. Max Daily Amount: 2 mg.   ??? Noreth-Ethinyl Estradiol-Iron (ZENCHENT FE) 0.4mg -7035mcg(21) and 75 mg (7) chew Take 1 Tab by mouth daily. Indications: DYSMENORRHEA   ??? IBUPROFEN (MOTRIN PO) Take  by mouth as needed.     No current facility-administered medications for this visit.      Past Medical History:   Diagnosis Date   ??? Former smoker    ??? Menorrhagia with irregular cycle      Past Surgical History:   Procedure Laterality Date   ??? HX CESAREAN SECTION      x2   ??? HX CYST REMOVAL     ??? HX WISDOM TEETH EXTRACTION        Social History     Social History   ??? Marital status: MARRIED     Spouse name: N/A   ??? Number of children: N/A   ??? Years of education: N/A  Occupational History   ??? Not on file.     Social History Main Topics   ??? Smoking status: Current Every Day Smoker     Start date: 05/19/2015   ??? Smokeless tobacco: Never Used      Comment: quit 1 year ago    ??? Alcohol use Yes      Comment: socially    ??? Drug use: No   ??? Sexual activity: Yes     Birth control/ protection: Pill     Other Topics Concern   ??? Not on file     Social History Narrative     Family History   Problem Relation Age of Onset   ??? Depression Mother    ??? No Known Problems Son     ??? No Known Problems Daughter    ??? Breast Cancer Neg Hx      No Known Allergies    Lab Only on 01/13/2016   Component Date Value Ref Range Status   ??? WBC (POC) 01/13/2016 4.7  3.5 - 10 10^3/ul Final   ??? RBC (POC) 01/13/2016 4.41  3.8 - 5.8 10^6/ul Final   ??? HGB (POC) 01/13/2016 13.9  11 - 16.5 g/dL Final   ??? HCT (POC) 09/81/1914 41.0  35 - 50 % Final   ??? MCV (POC) 01/13/2016 93  80 - 97 fL Final   ??? MCH (POC) 01/13/2016 31.5  26.5 - 33.5 pg Final   ??? MCHC (POC) 01/13/2016 33.9  31.5 - 35 g/dL Final   ??? RDW (POC) 78/29/5621 13.5  10.0 - 15.0 % Final   ??? PLATELET (POC) 01/13/2016 177  150 - 450 10^3/ul Final   ??? MPV (POC) 01/13/2016 6.7  6.5 - 11.0 fL Final   ??? LYMPHOCYTES (POC) 01/13/2016 26.6  17.0 - 48.0 % Final   ??? ABS. LYMPHS (POC) 01/13/2016 1.20  1.2 - 3.2 10^3/ul Final   ??? MONOCYTES (POC) 01/13/2016 2.8* 4.0 - 10.0 % Final   ??? ABS. MONOS (POC) 01/13/2016 0.10* 0.3 - 0.8 10^3/ul Final   ??? GRANULOCYTES (POC) 01/13/2016 70.6  43.0 - 76.0 % Final   ??? ABS. GRANS (POC) 01/13/2016 3.40  1.5 - 6.8 10^3/ul Final   ??? Glucose POC 01/13/2016 88  mg/dL Final    Fasting   ??? Cholesterol, total 01/13/2016 190  100 - 199 mg/dL Final   ??? Triglyceride 01/13/2016 134  0 - 149 mg/dL Final   ??? HDL Cholesterol 01/13/2016 77  >39 mg/dL Final   ??? VLDL, calculated 01/13/2016 27  5 - 40 mg/dL Final   ??? LDL, calculated 01/13/2016 86  0 - 99 mg/dL Final       This note will not be viewable in MyChart.

## 2016-01-19 NOTE — Patient Instructions (Signed)
Continue taking all the medications on this list as directed; this list is current and please discontinue any medications not on this list. Please call the office or use "My Chart" online to request medication refills prior to running out.  It was a pleasure to see and care for you this visit; I look forward to seeing you next time.   Also please remember that we keep "Urgent Care" visit slots in reserve every day for any acute illness or injury. If you ever have an urgent issue please call our office rather than going to an Urgent Care and we should always be able to see you within 24 hours.

## 2016-01-19 NOTE — Assessment & Plan Note (Signed)
Problem and/or symptoms are currently not stable and/or worsening on current treatment plan. Will have patient follow up as directed and make the following changes for further evaluation and/or treatment:   See Tx plan for Dep w/Anxiety

## 2016-01-19 NOTE — Assessment & Plan Note (Signed)
Between 3 and 10 minutes spent advising patient regarding the deleterious effects of tobacco use including increased risk of: stroke, heart attack, high blood pressure, cancer, etc. Patient voices understanding of risks and acknowledges it would be better if the patient avoids further tobacco exposure. Discussed with patient possible options to help with tobacco cessation including: nicotine gum/patches/vapor and medication options.  Patient plans to begin vaping as cigarette substitute.

## 2016-01-19 NOTE — Assessment & Plan Note (Signed)
Discussed ideal body weight and encouraged regular physical activity and healthy diet. Recommended routine preventative measures such as always wearing seatbelt, home safety measures such as improved traction for bathroom floor, and firearm safety. Counseled the patient regarding the rationale for the recommended immunizations and screenings and they are current and/or updated.

## 2016-01-19 NOTE — Assessment & Plan Note (Signed)
Problem and/or symptoms are currently not stable and/or worsening on current treatment plan. Will have patient follow up as directed and make the following changes for further evaluation and/or treatment:   Increasing Lexapro to 20 mg daily & continue with Xanax PRN for abatement of panic attacks. Encouraged pt to continue with regular counseling.

## 2016-07-18 ENCOUNTER — Inpatient Hospital Stay: Admit: 2016-07-18 | Payer: BLUE CROSS/BLUE SHIELD | Attending: Obstetrics & Gynecology | Primary: Family Medicine

## 2016-07-18 ENCOUNTER — Encounter: Attending: Family Medicine | Primary: Family Medicine

## 2016-07-18 DIAGNOSIS — Z1231 Encounter for screening mammogram for malignant neoplasm of breast: Secondary | ICD-10-CM

## 2016-07-21 ENCOUNTER — Ambulatory Visit
Admit: 2016-07-21 | Discharge: 2016-07-21 | Payer: BLUE CROSS/BLUE SHIELD | Attending: Family Medicine | Primary: Family Medicine

## 2016-07-21 DIAGNOSIS — F410 Panic disorder [episodic paroxysmal anxiety] without agoraphobia: Secondary | ICD-10-CM

## 2016-07-21 MED ORDER — ESCITALOPRAM 20 MG TAB
20 mg | ORAL_TABLET | Freq: Every day | ORAL | 1 refills | Status: DC
Start: 2016-07-21 — End: 2017-02-02

## 2016-07-21 MED ORDER — ALPRAZOLAM 0.5 MG TAB
0.5 mg | ORAL_TABLET | Freq: Two times a day (BID) | ORAL | 5 refills | Status: DC | PRN
Start: 2016-07-21 — End: 2017-02-02

## 2016-07-21 NOTE — Assessment & Plan Note (Signed)
Discussed with patient proper diet and exercise to include: correct percentage of protein, fat, and carbohydrates for each meal with the focus on a low carbohydrate diet; also regular cardiovascular and strength training exercise most days of the week. All questions answered and will follow up weight and adherence to lifestyle modifications at next visit.

## 2016-07-21 NOTE — Assessment & Plan Note (Signed)
Problem and/or Symptoms are currently stable and/or improving on current treatment plan.  Will continue and have patient follow up as directed.

## 2016-07-21 NOTE — Progress Notes (Signed)
Kermit Balo, M.D.  [facility]  [address] [place], [place] [address]  [phone]  Visit Date  07/21/2016  Patient Info  April Gay  41 y.o.female  DOB: 02/24/1976  MRN: [id number]    Subjective    Chief Complaint   Patient presents with   ??? Depression     F/U, refill med, med pended   ??? Panic Attack     F/U, refill med, med pended     Patient is here to discuss the following problems/concerns:  1. Panic anxiety syndrome    2. Depression, recurrent (HCC)    3. Tobacco use disorder    4. Weight gain, abnormal    5. Annual physical exam      HPI    Patient is here to follow up on medical problems including:  1. Panic anxiety syndrome    2. Depression, recurrent (HCC)    3. Tobacco use disorder    4. Weight gain, abnormal    5. Annual physical exam    Patient states that the chronic health problems are stable and well controlled on current regimens and has no acute complaints or concerns today with the exception of a 12 lb weight gain over the past 6 months.     Husband is awaiting trial for child pornography charges; the divorce proceedings are continuing and should be resolved within the next 3-4 months.     Review of Systems   Constitutional: Positive for unexpected weight change. Negative for fatigue and fever.   HENT: Negative for congestion and sore throat.    Eyes: Negative for pain and visual disturbance.   Respiratory: Negative for cough and shortness of breath.    Cardiovascular: Negative for chest pain and palpitations.   Gastrointestinal: Negative for abdominal pain, constipation, diarrhea and nausea.   Endocrine: Negative for polydipsia and polyphagia.   Genitourinary: Negative for difficulty urinating and menstrual problem.   Musculoskeletal: Negative for arthralgias and myalgias.   Skin: Negative for color change and rash.   Allergic/Immunologic: Negative for environmental allergies and food allergies.   Neurological: Negative for weakness and headaches.    Hematological: Does not bruise/bleed easily.   Psychiatric/Behavioral: Negative for dysphoric mood and sleep disturbance. The patient is nervous/anxious.        Objective    Vitals:    07/21/16 0906   BP: 106/72   Pulse: 97   SpO2: 99%   Weight: 128 lb 8 oz (58.3 kg)   Height: 5\' 3"  (1.6 m)     BP Readings from Last 3 Encounters:   07/21/16 106/72   01/19/16 116/76   11/19/15 112/72     Body mass index is 22.76 kg/(m^2).    Wt Readings from Last 3 Encounters:   07/21/16 128 lb 8 oz (58.3 kg)   01/19/16 116 lb 8 oz (52.8 kg)   11/19/15 119 lb (54 kg)       Physical Exam   Constitutional: She appears well-developed and well-nourished.   HENT:   Head: Normocephalic and atraumatic.   Eyes: Conjunctivae and EOM are normal.   Neck: Normal range of motion and phonation normal.   Cardiovascular: Normal rate.    Pulmonary/Chest: Effort normal. No respiratory distress.   Abdominal: Soft. Normal appearance.   Musculoskeletal: She exhibits no edema or deformity.   Neurological: She is alert. She displays no tremor. Coordination and gait normal.   Skin: Skin is dry. No rash noted.   Psychiatric: Her behavior is  normal. Her mood appears anxious. She does not exhibit a depressed mood.   Vitals reviewed.      Assessment/Plan  Problem List Items Addressed This Visit        HCC Disorders    Depression, recurrent (HCC)     Problem and/or Symptoms are currently stable and/or improving on current treatment plan.  Will continue and have patient follow up as directed.           Relevant Medications    ALPRAZolam (XANAX) 0.5 mg tablet    escitalopram oxalate (LEXAPRO) 20 mg tablet       Other    Annual physical exam      Problem not addressed today but diagnosis added to order future lab.               Relevant Orders    LIPID PANEL    CBC WITH AUTOMATED DIFF    AMB POC GLUCOSE, QUANTITATIVE, BLOOD    TSH REFLEX TO T4    Panic anxiety syndrome - Primary      Problem and/or Symptoms are currently stable and/or improving on  current treatment plan.  Will continue and have patient follow up as directed.  Decreasing # to dispense of Xanax to BID PRN dosing as frequency of panic attacks have decreased with the higher dose of Lexapro.              Relevant Medications    ALPRAZolam (XANAX) 0.5 mg tablet    escitalopram oxalate (LEXAPRO) 20 mg tablet    Tobacco use disorder      Between 3 and 10 minutes spent advising patient regarding the deleterious effects of tobacco use including increased risk of: stroke, heart attack, high blood pressure, cancer, etc. Patient voices understanding of risks and acknowledges it would be better if the patient avoids further tobacco exposure. Discussed with patient possible options to help with tobacco cessation including: nicotine gum/patches/vapor and medication options.               Relevant Medications    ALPRAZolam (XANAX) 0.5 mg tablet    escitalopram oxalate (LEXAPRO) 20 mg tablet    Weight gain, abnormal      Discussed with patient proper diet and exercise to include: correct percentage of protein, fat, and carbohydrates for each meal with the focus on a low carbohydrate diet; also regular cardiovascular and strength training exercise most days of the week. All questions answered and will follow up weight and adherence to lifestyle modifications at next visit.                     Orders Placed This Encounter   ??? LIPID PANEL     Standing Status:   Future     Standing Expiration Date:   07/21/2017   ??? CBC WITH AUTOMATED DIFF     Standing Status:   Future     Standing Expiration Date:   07/21/2017   ??? TSH REFLEX TO T4     Standing Status:   Future     Standing Expiration Date:   07/21/2017   ??? AMB POC GLUCOSE, QUANTITATIVE, BLOOD     Standing Status:   Future     Standing Expiration Date:   07/21/2017   ??? ALPRAZolam (XANAX) 0.5 mg tablet     Sig: Take 1 Tab by mouth two (2) times daily as needed for Anxiety. Max Daily Amount: 1 mg.     Dispense:  60 Tab  Refill:  5    ??? escitalopram oxalate (LEXAPRO) 20 mg tablet     Sig: Take 1 Tab by mouth daily.     Dispense:  92 Tab     Refill:  1       The following portions of the patient's history were reviewed and updated as appropriate: problem list, current medications, past medical history, past surgical history, past social history, family history, and allergies.    Patient Active Problem List   Diagnosis Code   ??? Depression, recurrent (HCC) F33.9   ??? Panic anxiety syndrome F41.0   ??? Tobacco use disorder F17.200   ??? Annual physical exam Z00.00   ??? Weight gain, abnormal R63.5     Current Outpatient Prescriptions   Medication Sig   ??? ALPRAZolam (XANAX) 0.5 mg tablet Take 1 Tab by mouth two (2) times daily as needed for Anxiety. Max Daily Amount: 1 mg.   ??? escitalopram oxalate (LEXAPRO) 20 mg tablet Take 1 Tab by mouth daily.   ??? Noreth-Ethinyl Estradiol-Iron (ZENCHENT FE) 0.4mg -45mcg(21) and 75 mg (7) chew Take 1 Tab by mouth daily. Indications: DYSMENORRHEA   ??? IBUPROFEN (MOTRIN PO) Take  by mouth as needed.     No current facility-administered medications for this visit.      Past Medical History:   Diagnosis Date   ??? Former smoker    ??? Menorrhagia with irregular cycle    ??? Weight gain, abnormal 07/21/2016     Past Surgical History:   Procedure Laterality Date   ??? HX CESAREAN SECTION      x2   ??? HX CYST REMOVAL     ??? HX WISDOM TEETH EXTRACTION       Social History     Social History   ??? Marital status: MARRIED     Spouse name: N/A   ??? Number of children: N/A   ??? Years of education: N/A     Occupational History   ??? Not on file.     Social History Main Topics   ??? Smoking status: Current Every Day Smoker     Start date: 05/19/2015   ??? Smokeless tobacco: Never Used      Comment: quit 1 year ago    ??? Alcohol use Yes      Comment: socially    ??? Drug use: No   ??? Sexual activity: Yes     Birth control/ protection: Pill     Other Topics Concern   ??? Not on file     Social History Narrative     Family History   Problem Relation Age of Onset    ??? Depression Mother    ??? No Known Problems Son    ??? No Known Problems Daughter    ??? Breast Cancer Neg Hx      No Known Allergies    This note will not be viewable in MyChart.

## 2016-07-21 NOTE — Assessment & Plan Note (Signed)
Problem and/or Symptoms are currently stable and/or improving on current treatment plan.  Will continue and have patient follow up as directed.  Decreasing # to dispense of Xanax to BID PRN dosing as frequency of panic attacks have decreased with the higher dose of Lexapro.

## 2016-07-21 NOTE — Progress Notes (Signed)
Alprazolam phoned to the Pharmacy.

## 2016-07-21 NOTE — Patient Instructions (Signed)
Continue taking all the medications on this list as directed; this list is current and please discontinue any medications not on this list. Please call the office or use "My Chart" online to request medication refills prior to running out.  It was a pleasure to see and care for you this visit; I look forward to seeing you next time.   Also please remember that we keep "Urgent Care" visit slots in reserve every day for any acute illness or injury. If you ever have an urgent issue please call our office rather than going to an Urgent Care and we should always be able to see you within 24 hours.

## 2016-07-21 NOTE — Assessment & Plan Note (Signed)
Between 3 and 10 minutes spent advising patient regarding the deleterious effects of tobacco use including increased risk of: stroke, heart attack, high blood pressure, cancer, etc. Patient voices understanding of risks and acknowledges it would be better if the patient avoids further tobacco exposure. Discussed with patient possible options to help with tobacco cessation including: nicotine gum/patches/vapor and medication options.

## 2016-07-21 NOTE — Assessment & Plan Note (Signed)
Problem not addressed today but diagnosis added to order future lab.

## 2016-08-09 ENCOUNTER — Ambulatory Visit
Admit: 2016-08-09 | Discharge: 2016-08-09 | Payer: BLUE CROSS/BLUE SHIELD | Attending: Obstetrics & Gynecology | Primary: Family Medicine

## 2016-08-09 DIAGNOSIS — Z01419 Encounter for gynecological examination (general) (routine) without abnormal findings: Secondary | ICD-10-CM

## 2016-08-09 LAB — AMB POC URINALYSIS DIP STICK AUTO W/O MICRO
Bilirubin (UA POC): NEGATIVE
Blood (UA POC): NEGATIVE
Glucose (UA POC): NEGATIVE
Ketones (UA POC): NEGATIVE
Leukocyte esterase (UA POC): NEGATIVE
Nitrites (UA POC): NEGATIVE
Protein (UA POC): NEGATIVE
Specific gravity (UA POC): 1.02 (ref 1.001–1.035)
Urobilinogen (UA POC): 0.2 (ref 0.2–1)
pH (UA POC): 5.5 (ref 4.6–8.0)

## 2016-08-09 LAB — AMB POC HEMOGLOBIN (HGB): Hemoglobin (POC): 12.8

## 2016-08-09 MED ORDER — NORETHIN-ETHINYL ESTRADIOL-IRON 0.4 MG-35 MCG(21)/75 MG(7) CHEW TABLET
PACK | Freq: Every day | ORAL | 4 refills | Status: DC
Start: 2016-08-09 — End: 2016-11-16

## 2016-08-09 NOTE — Progress Notes (Signed)
HISTORY OF PRESENT ILLNESS  April Gay is a 41 y.o. female.  HPI Comments: BCPs helping with dysmenorrhea/dyspareunia ( some what), enuff to stay on these and not have surgery    Well Woman   The history is provided by the patient. Pertinent negatives include no chest pain, no abdominal pain, no headaches and no shortness of breath.       Review of Systems   Constitutional: Negative for fever, malaise/fatigue and weight loss.   HENT: Negative for hearing loss and sore throat.    Eyes: Negative for blurred vision.   Respiratory: Negative for cough, hemoptysis and shortness of breath.    Cardiovascular: Negative for chest pain and palpitations.   Gastrointestinal: Negative for abdominal pain, blood in stool, constipation, diarrhea, heartburn, nausea and vomiting.   Genitourinary: Negative for dysuria, flank pain, frequency, hematuria and urgency.   Skin: Negative for itching and rash.   Neurological: Negative for focal weakness, weakness and headaches.   Psychiatric/Behavioral: Negative for depression. The patient does not have insomnia.    All other systems reviewed and are negative.      Physical Exam   Constitutional: She is oriented to person, place, and time. She appears well-developed and well-nourished.   HENT:   Mouth/Throat: Oropharynx is clear and moist.   Eyes: Right eye exhibits no discharge. Left eye exhibits no discharge.   Neck: Normal range of motion. Neck supple. No tracheal deviation present. No thyromegaly present.   Cardiovascular: Normal rate, regular rhythm, normal heart sounds and intact distal pulses.  Exam reveals no gallop and no friction rub.    No murmur heard.  Pulmonary/Chest: No respiratory distress. She has no wheezes. She has no rales. She exhibits no tenderness.   Abdominal: Bowel sounds are normal. She exhibits no distension and no mass. There is no tenderness. There is no rebound and no guarding.   Genitourinary: Vagina normal and uterus normal. Rectal exam shows no  external hemorrhoid, no internal hemorrhoid, no fissure, no mass, no tenderness, anal tone normal and guaiac negative stool. No breast swelling, tenderness, discharge or bleeding. There is no rash, tenderness, lesion or injury on the right labia. There is no rash, tenderness, lesion or injury on the left labia. Uterus is not deviated, not enlarged, not fixed and not tender. Cervix exhibits no motion tenderness and no discharge. Right adnexum displays no mass, no tenderness and no fullness. Left adnexum displays no mass, no tenderness and no fullness. No bleeding in the vagina. No foreign body in the vagina. No vaginal discharge found.   Genitourinary Comments: Pap not due   Musculoskeletal: Normal range of motion. She exhibits no edema, tenderness or deformity.   Lymphadenopathy:     She has no cervical adenopathy.   Neurological: She is alert and oriented to person, place, and time. Coordination normal.   Skin: Skin is warm and dry. No rash noted. No erythema. No pallor.   Psychiatric: She has a normal mood and affect. Her behavior is normal. Thought content normal.       ASSESSMENT and PLAN    ICD-10-CM ICD-9-CM    1. Well woman exam Z01.419 V72.31 AMB POC URINALYSIS DIP STICK AUTO W/O MICRO      AMB POC HEMOGLOBIN (HGB)   2. Screening for iron deficiency anemia Z13.0 V78.0 AMB POC HEMOGLOBIN (HGB)   3. Screening for genitourinary condition Z13.89 V81.6 AMB POC URINALYSIS DIP STICK AUTO W/O MICRO     Encounter Diagnoses   Name Primary?   ???  Well woman exam Yes   ??? Screening for iron deficiency anemia    ??? Screening for genitourinary condition      Orders Placed This Encounter   ??? Urinalysis, Auto w/o Micro (81003)   ??? Hemoglobin ([id number])   ??? Noreth-Ethinyl Estradiol-Iron (ZENCHENT FE) 0.4mg -735mcg(21) and 75 mg (7) chew     Orders Placed This Encounter   ??? Urinalysis, Auto w/o Micro (81003)   ??? Hemoglobin ([id number])   ??? Noreth-Ethinyl Estradiol-Iron (ZENCHENT FE) 0.4mg -1735mcg(21) and 75 mg (7) chew      Sig: Take 1 Tab by mouth daily. Indications: DYSMENORRHEA     Dispense:  3 Package     Refill:  4     Orders Placed This Encounter   ??? Urinalysis, Auto w/o Micro (81003)   ??? Hemoglobin ([id number])   ??? Noreth-Ethinyl Estradiol-Iron (ZENCHENT FE) 0.4mg -4935mcg(21) and 75 mg (7) chew     Diagnoses and all orders for this visit:    1. Well woman exam  -     Urinalysis, Auto w/o Micro (81003)  -     Hemoglobin ([id number])    2. Screening for iron deficiency anemia  -     Hemoglobin ([id number])    3. Screening for genitourinary condition  -     Urinalysis, Auto w/o Micro (81003)    Other orders  -     Noreth-Ethinyl Estradiol-Iron (ZENCHENT FE) 0.4mg -3335mcg(21) and 75 mg (7) chew; Take 1 Tab by mouth daily. Indications: DYSMENORRHEA      Follow-up Disposition:  Return in about 1 year (around 08/09/2017) for yearly physical.  current treatment plan is effective, no change in therapy

## 2016-10-14 ENCOUNTER — Inpatient Hospital Stay
Admit: 2016-10-14 | Discharge: 2016-10-14 | Disposition: A | Discharge status: Home / Self Care | Payer: BLUE CROSS/BLUE SHIELD | Attending: Emergency Medicine

## 2016-10-14 DIAGNOSIS — N938 Other specified abnormal uterine and vaginal bleeding: Secondary | ICD-10-CM

## 2016-10-14 LAB — CBC WITH AUTOMATED DIFF
ABS. BASOPHILS: 0 10*3/uL (ref 0.0–0.2)
ABS. EOSINOPHILS: 0 10*3/uL (ref 0.0–0.8)
ABS. IMM. GRANS.: 0 10*3/uL (ref 0.0–0.5)
ABS. LYMPHOCYTES: 2.1 10*3/uL (ref 0.5–4.6)
ABS. MONOCYTES: 0.5 10*3/uL (ref 0.1–1.3)
ABS. NEUTROPHILS: 2.8 10*3/uL (ref 1.7–8.2)
BASOPHILS: 0 % (ref 0.0–2.0)
EOSINOPHILS: 1 % (ref 0.5–7.8)
HCT: 36.1 % (ref 35.8–46.3)
HGB: 12 g/dL (ref 11.7–15.4)
IMMATURE GRANULOCYTES: 0 % (ref 0.0–5.0)
LYMPHOCYTES: 38 % (ref 13–44)
MCH: 32 PG (ref 26.1–32.9)
MCHC: 33.2 g/dL (ref 31.4–35.0)
MCV: 96.3 FL (ref 79.6–97.8)
MONOCYTES: 9 % (ref 4.0–12.0)
MPV: 9.2 FL — ABNORMAL LOW (ref 10.8–14.1)
NEUTROPHILS: 52 % (ref 43–78)
PLATELET: 192 10*3/uL (ref 150–450)
RBC: 3.75 M/uL — ABNORMAL LOW (ref 4.05–5.25)
RDW: 12.1 % (ref 11.9–14.6)
WBC: 5.5 10*3/uL (ref 4.3–11.1)

## 2016-10-14 MED ORDER — SODIUM CHLORIDE 0.9 % IJ SYRG
Freq: Three times a day (TID) | INTRAMUSCULAR | Status: DC
Start: 2016-10-14 — End: 2016-10-14

## 2016-10-14 MED ORDER — SODIUM CHLORIDE 0.9 % IJ SYRG
INTRAMUSCULAR | Status: DC | PRN
Start: 2016-10-14 — End: 2016-10-14

## 2016-10-14 NOTE — ED Notes (Signed)

## 2016-10-14 NOTE — Telephone Encounter (Signed)
Pt has appt to discuss a hysterectomy August 8. Pt c/o that she is having heavy vaginal bleeding. Pt is changing a pad an hour. Informed pt to go to urgent care or the ED. Pt v/u

## 2016-10-14 NOTE — ED Triage Notes (Addendum)
Patient states abnormal vaginal bleeding for years but worse today. Soaking 1 pad an hour. Patient states appointment with ob/gyn 10/26/16 for possible hysterectomy. Called ob/gyn this am and told to come to ed

## 2016-10-14 NOTE — ED Provider Notes (Signed)
HPI Comments: Patient is a 41 year old female with history of fibroids, endometriosis and dysfunctional uterine bleeding presenting with vaginal bleeding. Reports she is using about a pad an hour.  She states she's been  bleeding now for about 8 days.  Describes no significant abdominal cramping.  States she feels slightly lightheaded but no shortness of breath, chest pain, vomiting or urinary symptoms.  She has a hysterectomy scheduled for next month.  She is followed by Dr. Janice Norrie for gynecologic issues.she reports she called the oncology office today and they told her to come to the emergency department.    Patient is a 41 y.o. female presenting with vaginal bleeding. The history is provided by the patient. No language interpreter was used.   Vaginal Bleeding   Pertinent negatives include no abdominal pain and no shortness of breath.        Past Medical History:   Diagnosis Date   ??? Depression, recurrent (HCC)    ??? Former smoker    ??? Menorrhagia with irregular cycle    ??? Weight gain, abnormal 07/21/2016       Past Surgical History:   Procedure Laterality Date   ??? HX CESAREAN SECTION      x2   ??? HX CYST REMOVAL     ??? HX WISDOM TEETH EXTRACTION           Family History:   Problem Relation Age of Onset   ??? Depression Mother    ??? No Known Problems Son    ??? No Known Problems Daughter    ??? Breast Cancer Neg Hx        Social History     Social History   ??? Marital status: MARRIED     Spouse name: N/A   ??? Number of children: N/A   ??? Years of education: N/A     Occupational History   ??? Not on file.     Social History Main Topics   ??? Smoking status: Current Every Day Smoker     Start date: 05/19/2015   ??? Smokeless tobacco: Never Used      Comment: quit 1 year ago    ??? Alcohol use Yes      Comment: socially    ??? Drug use: No   ??? Sexual activity: Yes     Birth control/ protection: Pill     Other Topics Concern   ??? Not on file     Social History Narrative          ALLERGIES: Review of patient's allergies indicates no known allergies.    Review of Systems   Constitutional: Negative for fatigue.   HENT: Negative for congestion.    Respiratory: Negative for cough and shortness of breath.    Gastrointestinal: Negative for abdominal pain, nausea and vomiting.   Genitourinary: Positive for vaginal bleeding. Negative for vaginal discharge.   Musculoskeletal: Negative for back pain.   Neurological: Positive for light-headedness.   Psychiatric/Behavioral: Negative for confusion.       Vitals:    10/14/16 1022   BP: 137/84   Pulse: 81   Resp: 16   Temp: 98.9 ??F (37.2 ??C)   SpO2: 100%   Weight: 59.4 kg (131 lb)   Height: 5\' 3"  (1.6 m)            Physical Exam   Constitutional: She is oriented to person, place, and time. She appears well-developed and well-nourished. No distress.   HENT:   Head: Normocephalic and atraumatic.  Eyes: Conjunctivae and EOM are normal. Pupils are equal, round, and reactive to light.   Neck: Normal range of motion. Neck supple.   Cardiovascular: Normal rate, regular rhythm and normal heart sounds.    Pulmonary/Chest: Effort normal and breath sounds normal. No respiratory distress. She has no wheezes. She has no rales.   Abdominal: Soft. She exhibits no distension. There is no tenderness. There is no rebound.   Genitourinary:   Genitourinary Comments: I discussed performing a pelvic exam with the patient.  She prefers to follow-up with her gynecologist and declined the exam.   Musculoskeletal: Normal range of motion. She exhibits no edema or tenderness.   Neurological: She is alert and oriented to person, place, and time.   Skin: Skin is warm and dry. No rash noted. She is not diaphoretic.   Psychiatric: She has a normal mood and affect. Her behavior is normal.   Vitals reviewed.       MDM  Number of Diagnoses or Management Options  DUB (dysfunctional uterine bleeding): new and does not require workup   Diagnosis management comments: Hemoglobin stable.  Vitals reassuring.  Declined pelvic exam.  Discharged home with instructions to follow-up with gynecologist as soon as possible.      I discussed the results of all labs, procedures, radiographs, and treatments with the patient and available family.  Treatment plan is agreed upon and the patient is ready for discharge.  All voiced understanding of the discharge plan and medication instructions or changes as appropriate.  Questions about treatment in the ED were answered.  All were encouraged to return should symptoms worsen or new problems develop.  ??       Amount and/or Complexity of Data Reviewed  Clinical lab tests: ordered and reviewed (Results for orders placed or performed during the hospital encounter of 10/14/16  -CBC WITH AUTOMATED DIFF       Result                                            Value                         Ref Range                       WBC                                               5.5                           4.3 - 11.1 K/uL                 RBC                                               3.75 (L)                      4.05 - 5.25 M/uL  HGB                                               12.0                          11.7 - 15.4 g/dL                HCT                                               36.1                          35.8 - 46.3 %                   MCV                                               96.3                          79.6 - 97.8 FL                  MCH                                               32.0                          26.1 - 32.9 PG                  MCHC                                              33.2                          31.4 - 35.0 g/dL                RDW                                               12.1                          11.9 - 14.6 %                   PLATELET  192                           150 - 450 K/uL                   MPV                                               9.2 (L)                       10.8 - 14.1 FL                  DF                                                AUTOMATED                                                     NEUTROPHILS                                       52                            43 - 78 %                       LYMPHOCYTES                                       38                            13 - 44 %                       MONOCYTES                                         9                             4.0 - 12.0 %                    EOSINOPHILS                                       1                             0.5 - 7.8 %  BASOPHILS                                         0                             0.0 - 2.0 %                     IMMATURE GRANULOCYTES                             0                             0.0 - 5.0 %                     ABS. NEUTROPHILS                                  2.8                           1.7 - 8.2 K/UL                  ABS. LYMPHOCYTES                                  2.1                           0.5 - 4.6 K/UL                  ABS. MONOCYTES                                    0.5                           0.1 - 1.3 K/UL                  ABS. EOSINOPHILS                                  0.0                           0.0 - 0.8 K/UL                  ABS. BASOPHILS                                    0.0                           0.0 - 0.2 K/UL                  ABS. IMM. GRANS.  0.0                           0.0 - 0.5 K/UL             )  Review and summarize past medical records: yes  Independent visualization of images, tracings, or specimens: yes    Risk of Complications, Morbidity, and/or Mortality  Presenting problems: moderate  Diagnostic procedures: moderate  Management options: moderate    Patient Progress  Patient progress: stable        ED Course       Procedures

## 2016-10-16 ENCOUNTER — Inpatient Hospital Stay
Admit: 2016-10-16 | Discharge: 2016-10-16 | Disposition: A | Discharge status: Home / Self Care | Payer: BLUE CROSS/BLUE SHIELD | Attending: Emergency Medicine

## 2016-10-16 ENCOUNTER — Observation Stay: Admit: 2016-10-17 | Payer: BLUE CROSS/BLUE SHIELD | Primary: Family Medicine

## 2016-10-16 ENCOUNTER — Observation Stay

## 2016-10-16 DIAGNOSIS — N938 Other specified abnormal uterine and vaginal bleeding: Secondary | ICD-10-CM

## 2016-10-16 LAB — CBC WITH AUTOMATED DIFF
ABS. BASOPHILS: 0 10*3/uL (ref 0.0–0.2)
ABS. EOSINOPHILS: 0.1 10*3/uL (ref 0.0–0.8)
ABS. IMM. GRANS.: 0 10*3/uL (ref 0.0–0.5)
ABS. LYMPHOCYTES: 2 10*3/uL (ref 0.5–4.6)
ABS. MONOCYTES: 0.5 10*3/uL (ref 0.1–1.3)
ABS. NEUTROPHILS: 4.6 10*3/uL (ref 1.7–8.2)
BASOPHILS: 0 % (ref 0.0–2.0)
EOSINOPHILS: 1 % (ref 0.5–7.8)
HCT: 24.3 % — ABNORMAL LOW (ref 35.8–46.3)
HGB: 8 g/dL — ABNORMAL LOW (ref 11.7–15.4)
IMMATURE GRANULOCYTES: 0 % (ref 0.0–5.0)
LYMPHOCYTES: 28 % (ref 13–44)
MCH: 31.7 PG (ref 26.1–32.9)
MCHC: 32.9 g/dL (ref 31.4–35.0)
MCV: 96.4 FL (ref 79.6–97.8)
MONOCYTES: 7 % (ref 4.0–12.0)
MPV: 9.6 FL — ABNORMAL LOW (ref 10.8–14.1)
NEUTROPHILS: 64 % (ref 43–78)
PLATELET: 180 10*3/uL (ref 150–450)
RBC: 2.52 M/uL — ABNORMAL LOW (ref 4.05–5.25)
RDW: 11.7 % — ABNORMAL LOW (ref 11.9–14.6)
WBC: 7.1 10*3/uL (ref 4.3–11.1)

## 2016-10-16 LAB — METABOLIC PANEL, COMPREHENSIVE
A-G Ratio: 1.1 — ABNORMAL LOW (ref 1.2–3.5)
ALT (SGPT): 11 U/L — ABNORMAL LOW (ref 12–65)
AST (SGOT): 18 U/L (ref 15–37)
Albumin: 3.2 g/dL — ABNORMAL LOW (ref 3.5–5.0)
Alk. phosphatase: 28 U/L — ABNORMAL LOW (ref 50–136)
Anion gap: 11 mmol/L (ref 7–16)
BUN: 13 MG/DL (ref 6–23)
Bilirubin, total: 0.3 MG/DL (ref 0.2–1.1)
CO2: 24 mmol/L (ref 21–32)
Calcium: 8.6 MG/DL (ref 8.3–10.4)
Chloride: 103 mmol/L (ref 98–107)
Creatinine: 1.03 MG/DL — ABNORMAL HIGH (ref 0.6–1.0)
GFR est AA: >60 mL/min/{1.73_m2} (ref >60)
GFR est non-AA: >60 mL/min/{1.73_m2} (ref >60)
Globulin: 3 g/dL (ref 2.3–3.5)
Glucose: 117 mg/dL — ABNORMAL HIGH (ref 65–100)
Potassium: 3.2 mmol/L — ABNORMAL LOW (ref 3.5–5.1)
Protein, total: 6.2 g/dL — ABNORMAL LOW (ref 6.3–8.2)
Sodium: 138 mmol/L (ref 136–145)

## 2016-10-16 LAB — URINE MICROSCOPIC
Bacteria: 0 /hpf
Casts: 0 /lpf
Crystals, urine: 0 /LPF
Mucus: 0 /lpf
RBC: >100 /hpf

## 2016-10-16 LAB — HCG QL SERUM: HCG, Ql.: NEGATIVE

## 2016-10-16 MED ORDER — SODIUM CHLORIDE 0.9 % IV
INTRAVENOUS | Status: DC | PRN
Start: 2016-10-16 — End: 2016-10-17

## 2016-10-16 MED ORDER — ACETAMINOPHEN 325 MG TABLET
325 mg | Freq: Four times a day (QID) | ORAL | Status: DC | PRN
Start: 2016-10-16 — End: 2016-10-17

## 2016-10-16 MED ORDER — LORAZEPAM 2 MG/ML IJ SOLN
2 mg/mL | Freq: Four times a day (QID) | INTRAMUSCULAR | Status: DC | PRN
Start: 2016-10-16 — End: 2016-10-17

## 2016-10-16 MED ORDER — ALPRAZOLAM 0.5 MG TAB
0.5 mg | Freq: Two times a day (BID) | ORAL | Status: DC | PRN
Start: 2016-10-16 — End: 2016-10-17
  Administered 2016-10-17 (×2): via ORAL

## 2016-10-16 MED ORDER — SODIUM CHLORIDE 0.9% BOLUS IV
0.9 % | Freq: Once | INTRAVENOUS | Status: AC
Start: 2016-10-16 — End: 2016-10-16
  Administered 2016-10-16: 21:00:00 via INTRAVENOUS

## 2016-10-16 MED ORDER — SODIUM CHLORIDE 0.9% BOLUS IV
0.9 % | Freq: Once | INTRAVENOUS | Status: AC
Start: 2016-10-16 — End: 2016-10-16
  Administered 2016-10-16: 20:00:00 via INTRAVENOUS

## 2016-10-16 MED ORDER — ESCITALOPRAM 10 MG TAB
10 mg | Freq: Every day | ORAL | Status: DC
Start: 2016-10-16 — End: 2016-10-16

## 2016-10-16 MED ORDER — MEGESTROL 40 MG TAB
40 mg | Freq: Every day | ORAL | Status: DC
Start: 2016-10-16 — End: 2016-10-17
  Administered 2016-10-17 (×2): via ORAL

## 2016-10-16 MED ORDER — DIPHENHYDRAMINE 25 MG CAP
25 mg | Freq: Four times a day (QID) | ORAL | Status: DC | PRN
Start: 2016-10-16 — End: 2016-10-17

## 2016-10-16 MED FILL — SODIUM CHLORIDE 0.9 % IV: INTRAVENOUS | Qty: 1000

## 2016-10-16 NOTE — ED Notes (Signed)
TRANSFER - OUT REPORT:    Verbal report given to April Gay on April Gay  being transferred to 362 for routine progression of care       Report consisted of patient???s Situation, Background, Assessment and   Recommendations(SBAR).     Information from the following report(s) SBAR and MAR was reviewed with the receiving nurse.    Lines:   Peripheral IV 10/16/16 Right Antecubital (Active)   Site Assessment Clean, dry, & intact 10/16/2016  3:36 PM   Phlebitis Assessment 0 10/16/2016  3:36 PM   Infiltration Assessment 0 10/16/2016  3:36 PM   Dressing Status Clean, dry, & intact 10/16/2016  3:36 PM        Opportunity for questions and clarification was provided.      Patient transported with:

## 2016-10-16 NOTE — ED Notes (Signed)
GYN-Dr Jacquelin HawkingMartin French. Pt c/o heavy vaginal bleeding since 7/19. Was seen here on 10/14/16 for same complaint. Pt is vague about the number of pads she is using as she states that she "just runs to the bathroom and it's like I'm peeing".

## 2016-10-16 NOTE — Progress Notes (Signed)
Pt arrived from ER. Vitals within normal limits. Pt denies any complaints. Instructed to advise RN of a pad count. She signed a blood transfusion consent form and it was placed in chart. Pt's family is going to get her food. PIV intact and infusing. Pt denies any present needs. Oriented to room, call light, and plan of care. All safety measures are in place.

## 2016-10-16 NOTE — Progress Notes (Signed)
Problem: Falls - Risk of  Goal: *Absence of Falls  Document Schmid Fall Risk and appropriate interventions in the flowsheet.  Outcome: Progressing Towards Goal  Fall Risk Interventions:

## 2016-10-16 NOTE — ED Provider Notes (Addendum)
HPI Comments: Patient presents to the ER complaining of continued vaginal bleeding.  Has a history of dysmenorrhea and abnormal uterine bleeding.  Was seen here 2 days ago for similar complaints.  Had normal hemoglobin and hematocrit. Declined Pelvic exam.  She currently is on outpatient birth control.  Reports that she left the ER symptoms have persisted.  Had an episode of lightheadedness on yesterday as well as one today but denies any syncope.  Does report some crampy abdominal pain. Denies any fevers or vomiting    Patient is a 41 y.o. female presenting with vaginal bleeding. The history is provided by the patient.   Vaginal Bleeding   This is a recurrent problem. The current episode started more than 1 week ago. The problem has been gradually worsening. Associated symptoms include abdominal pain. Pertinent negatives include no shortness of breath.        Past Medical History:   Diagnosis Date   ??? Depression, recurrent ([place])    ??? Former smoker    ??? Menorrhagia with irregular cycle    ??? Weight gain, abnormal 07/21/2016       Past Surgical History:   Procedure Laterality Date   ??? HX CESAREAN SECTION      x2   ??? HX CYST REMOVAL     ??? HX WISDOM TEETH EXTRACTION           Family History:   Problem Relation Age of Onset   ??? Depression Mother    ??? No Known Problems Son    ??? No Known Problems Daughter    ??? Breast Cancer Neg Hx        Social History     Social History   ??? Marital status: MARRIED     Spouse name: N/A   ??? Number of children: N/A   ??? Years of education: N/A     Occupational History   ??? Not on file.     Social History Main Topics   ??? Smoking status: Current Every Day Smoker     Start date: 05/19/2015   ??? Smokeless tobacco: Never Used      Comment: quit 1 year ago    ??? Alcohol use Yes      Comment: socially    ??? Drug use: No   ??? Sexual activity: Yes     Birth control/ protection: Pill     Other Topics Concern   ??? Not on file     Social History Narrative          ALLERGIES: Review of patient's allergies indicates no known allergies.    Review of Systems   Constitutional: Negative for fatigue and fever.   HENT: Negative for dental problem.    Eyes: Negative for photophobia, redness and visual disturbance.   Respiratory: Negative for cough, chest tightness and shortness of breath.    Gastrointestinal: Positive for abdominal pain. Negative for nausea and vomiting.   Endocrine: Negative for polydipsia, polyphagia and polyuria.   Genitourinary: Positive for vaginal bleeding. Negative for dysuria and flank pain.   Musculoskeletal: Negative for back pain and gait problem.   Skin: Negative for pallor and rash.   Allergic/Immunologic: Negative for food allergies and immunocompromised state.   Neurological: Positive for light-headedness. Negative for speech difficulty.   Psychiatric/Behavioral: Negative for confusion.   All other systems reviewed and are negative.      Vitals:    10/16/16 1530   BP: 141/65   Pulse: (!) 124   Temp:  98.2 ??F (36.8 ??C)   SpO2: 100%   Height: 5' 3" (1.6 m)            Physical Exam   Constitutional: She is oriented to person, place, and time. She appears well-developed and well-nourished.   HENT:   Head: Normocephalic and atraumatic.   Mouth/Throat: Oropharynx is clear and moist.   Eyes: Conjunctivae and EOM are normal. Pupils are equal, round, and reactive to light.   Cardiovascular: Regular rhythm.  Tachycardia present.  Exam reveals no friction rub.    No murmur heard.  Pulmonary/Chest: Breath sounds normal.   Abdominal: Soft. Bowel sounds are normal. She exhibits no distension. There is no tenderness.   Genitourinary: Cervix exhibits no motion tenderness, no discharge and no friability. There is bleeding in the vagina. No vaginal discharge found.       Neurological: She is alert and oriented to person, place, and time. She has normal reflexes.   Nursing note and vitals reviewed.       MDM  Number of Diagnoses or Management Options   Diagnosis management comments: We'll check hemoglobin and hematocrit, perform pelvic exam    4:51 PM  Labs are significant for hemoglobin of 8.0.  Creatinine of 1.03.    Case has been discussed with on-call gynecology Dr. Tessie Fass  Will admit for further observation and hydration.  We'll not transfuse at this time.       Amount and/or Complexity of Data Reviewed  Clinical lab tests: ordered and reviewed    Risk of Complications, Morbidity, and/or Mortality  Presenting problems: moderate  Diagnostic procedures: low  Management options: moderate    Patient Progress  Patient progress: stable        ED Course       Procedures           Results Include:    Recent Results (from the past 24 hour(s))   HCG QL SERUM    Collection Time: 10/16/16  3:34 PM   Result Value Ref Range    HCG, Ql. NEGATIVE  NEG     CBC WITH AUTOMATED DIFF    Collection Time: 10/16/16  3:38 PM   Result Value Ref Range    WBC 7.1 4.3 - 11.1 K/uL    RBC 2.52 (L) 4.05 - 5.25 M/uL    HGB 8.0 (L) 11.7 - 15.4 g/dL    HCT 24.3 (L) 35.8 - 46.3 %    MCV 96.4 79.6 - 97.8 FL    MCH 31.7 26.1 - 32.9 PG    MCHC 32.9 31.4 - 35.0 g/dL    RDW 11.7 (L) 11.9 - 14.6 %    PLATELET 180 150 - 450 K/uL    MPV 9.6 (L) 10.8 - 14.1 FL    DF AUTOMATED      NEUTROPHILS 64 43 - 78 %    LYMPHOCYTES 28 13 - 44 %    MONOCYTES 7 4.0 - 12.0 %    EOSINOPHILS 1 0.5 - 7.8 %    BASOPHILS 0 0.0 - 2.0 %    IMMATURE GRANULOCYTES 0 0.0 - 5.0 %    ABS. NEUTROPHILS 4.6 1.7 - 8.2 K/UL    ABS. LYMPHOCYTES 2.0 0.5 - 4.6 K/UL    ABS. MONOCYTES 0.5 0.1 - 1.3 K/UL    ABS. EOSINOPHILS 0.1 0.0 - 0.8 K/UL    ABS. BASOPHILS 0.0 0.0 - 0.2 K/UL    ABS. IMM. GRANS. 0.0 0.0 - 0.5 K/UL   METABOLIC PANEL,  COMPREHENSIVE    Collection Time: 10/16/16  3:38 PM   Result Value Ref Range    Sodium 138 136 - 145 mmol/L    Potassium 3.2 (L) 3.5 - 5.1 mmol/L    Chloride 103 98 - 107 mmol/L    CO2 24 21 - 32 mmol/L    Anion gap 11 7 - 16 mmol/L    Glucose 117 (H) 65 - 100 mg/dL    BUN 13 6 - 23 MG/DL     Creatinine 1.03 (H) 0.6 - 1.0 MG/DL    GFR est AA >60 >60 ml/min/1.86m    GFR est non-AA >60 >60 ml/min/1.718m   Calcium 8.6 8.3 - 10.4 MG/DL    Bilirubin, total 0.3 0.2 - 1.1 MG/DL    ALT (SGPT) 11 (L) 12 - 65 U/L    AST (SGOT) 18 15 - 37 U/L    Alk. phosphatase 28 (L) 50 - 136 U/L    Protein, total 6.2 (L) 6.3 - 8.2 g/dL    Albumin 3.2 (L) 3.5 - 5.0 g/dL    Globulin 3.0 2.3 - 3.5 g/dL    A-G Ratio 1.1 (L) 1.2 - 3.5     URINE MICROSCOPIC    Collection Time: 10/16/16  3:38 PM   Result Value Ref Range    WBC 20-50 0 /hpf    RBC >100 0 /hpf    Epithelial cells 10-20 0 /hpf    Bacteria 0 0 /hpf    Casts 0 0 /lpf    Crystals, urine 0 0 /LPF    Mucus 0 0 /lpf    Other observations RESULTS VERIFIED MANUALLY

## 2016-10-16 NOTE — Progress Notes (Signed)
Admission assessment complete. Pt A&O. VSS. IV clean, dry, intact, blood infusing per orders. Family at bedside. Pt experiencing uterine bleeding, peri-pad in place, pt informed to let staff know how many pads she saturates. Pt ambulates ad lib. Bed low and locked, call light within reach. Pt states having a headache at this time, requesting ibuprofen over tylenol, will notify MD. Lungs CTA, respirations even and unlabored. HR regular. Will continue to monitor.

## 2016-10-16 NOTE — Progress Notes (Signed)
TRANSFER - IN REPORT:    Verbal report received from Shaver LakeBeth, RN (name) on April Gay  being received from ER (unit) for routine progression of care      Report consisted of patient???s Situation, Background, Assessment and   Recommendations(SBAR).     Information from the following report(s) ED Summary was reviewed with the receiving nurse.    Opportunity for questions and clarification was provided.      Assessment completed upon patient???s arrival to unit and care assumed.

## 2016-10-16 NOTE — Progress Notes (Signed)
Blood transfusion started.

## 2016-10-16 NOTE — H&P (Signed)
Gynecology History and Physical    Name: April CottonCindy D Gay MRN: [id number] SSN: [id number]    Date of Birth: 24-Jul-1975  Age: 41 y.o.  Sex: female       Subjective:      Chief complaint:  Abnormal uterine bleeding, Anemia, Endometriosis and Fibroids    April Gay is a 41 y.o. female patient of Dr. Janice NorrieFrench with a history of abnormal uterine bleeding, endometriosis, fibroids and vaginal bleeding.  Previous treatment measures include oral contraceptives which have been adequate to control her flow until recently.  She was seen in ED recently and had dropped Hgb significantly so as to warrant inpatient evaluation and management.    The current method of family planning is oral contraceptive (estrogen/progesterone).    OB History     Gravida Para Term Preterm AB Living    2 2 2   2     SAB TAB Ectopic Molar Multiple Live Births                 Past Medical History:   Diagnosis Date   ??? Depression, recurrent ([facility])    ??? Former smoker    ??? Menorrhagia with irregular cycle    ??? Weight gain, abnormal 07/21/2016     Past Surgical History:   Procedure Laterality Date   ??? HX CESAREAN SECTION      x2   ??? HX CYST REMOVAL     ??? HX WISDOM TEETH EXTRACTION       Social History     Occupational History   ??? Not on file.     Social History Main Topics   ??? Smoking status: Current Every Day Smoker     Start date: 05/19/2015   ??? Smokeless tobacco: Never Used      Comment: quit 1 year ago    ??? Alcohol use Yes      Comment: socially    ??? Drug use: No   ??? Sexual activity: Yes     Birth control/ protection: Pill     Family History   Problem Relation Age of Onset   ??? Depression Mother    ??? No Known Problems Son    ??? No Known Problems Daughter    ??? Breast Cancer Neg Hx         No Known Allergies  Prior to Admission medications    Medication Sig Start Date End Date Taking? Authorizing Provider   IBUPROFEN (MOTRIN PO) Take  by mouth as needed.   Yes Historical Provider   Noreth-Ethinyl Estradiol-Iron (ZENCHENT FE) 0.4mg -2835mcg(21) and 75 mg (7)  chew Take 1 Tab by mouth daily. Indications: DYSMENORRHEA 08/09/16   Abigail MiyamotoGlenn M French, MD   ALPRAZolam Prudy Feeler(XANAX) 0.5 mg tablet Take 1 Tab by mouth two (2) times daily as needed for Anxiety. Max Daily Amount: 1 mg. 07/21/16   Renaldo FiddlerMichael L Peters, MD   escitalopram oxalate (LEXAPRO) 20 mg tablet Take 1 Tab by mouth daily. 07/21/16   Renaldo FiddlerMichael L Peters, MD        Review of Systems  A comprehensive review of systems was negative except for that written in the History of Present Illness.    Objective:     Vitals:    10/16/16 1631 10/16/16 1632 10/16/16 1633 10/16/16 1652   BP:    129/71   Pulse:       Temp:       SpO2: 100% 100% 100%    Height:  Physical Exam:  Patient with minimal distress.  Heart: Regular rate and rhythm  Lung: clear to auscultation throughout lung fields, no wheezes, no rales, no rhonchi and normal respiratory effort  Abdomen: soft, nontender  External Genitalia: normal general appearance  Vagina: normal mucosa without prolapse or lesions and presence of blood  Cervix: normal appearance  Adnexa: normal bimanual exam  Uterus: normal single, nontender and irregular enlargement    Assessment/Plan:     Active Problems:    DUB (dysfunctional uterine bleeding) (10/16/2016)       Acute Menorrhagia  - Transfuse 2 units PRBC's   - Pelvic USG   - Follow serial hemoglobins   - Megace   - Surgery scheduled soon per Dr. Janice NorrieFrench      Signed By:  Vale HavenJason Alexander Lenor Provencher, MD     October 16, 2016

## 2016-10-17 LAB — CBC W/O DIFF
HCT: 26.4 % — ABNORMAL LOW (ref 35.8–46.3)
HGB: 8.9 g/dL — ABNORMAL LOW (ref 11.7–15.4)
MCH: 30.6 PG (ref 26.1–32.9)
MCHC: 33.7 g/dL (ref 31.4–35.0)
MCV: 90.7 FL (ref 79.6–97.8)
MPV: 9.4 FL — ABNORMAL LOW (ref 10.8–14.1)
PLATELET: 110 10*3/uL — ABNORMAL LOW (ref 150–450)
RBC: 2.91 M/uL — ABNORMAL LOW (ref 4.05–5.25)
RDW: 14.9 % — ABNORMAL HIGH (ref 11.9–14.6)
WBC: 6.2 10*3/uL (ref 4.3–11.1)

## 2016-10-17 LAB — METABOLIC PANEL, BASIC
Anion gap: 9 mmol/L (ref 7–16)
BUN: 7 MG/DL (ref 6–23)
CO2: 23 mmol/L (ref 21–32)
Calcium: 7.7 MG/DL — ABNORMAL LOW (ref 8.3–10.4)
Chloride: 108 mmol/L — ABNORMAL HIGH (ref 98–107)
Creatinine: 0.69 MG/DL (ref 0.6–1.0)
GFR est AA: >60 mL/min/{1.73_m2} (ref >60)
GFR est non-AA: >60 mL/min/{1.73_m2} (ref >60)
Glucose: 101 mg/dL — ABNORMAL HIGH (ref 65–100)
Potassium: 3.2 mmol/L — ABNORMAL LOW (ref 3.5–5.1)
Sodium: 140 mmol/L (ref 136–145)

## 2016-10-17 LAB — HEMOGLOBIN: HGB: 10.5 g/dL — ABNORMAL LOW (ref 11.7–15.4)

## 2016-10-17 MED ORDER — ESCITALOPRAM 10 MG TAB
10 mg | Freq: Every evening | ORAL | Status: DC
Start: 2016-10-17 — End: 2016-10-17
  Administered 2016-10-17: 01:00:00 via ORAL

## 2016-10-17 MED ORDER — NORETHIN-ETHINYL ESTRADIOL-IRON 0.4 MG-35 MCG(21)/75 MG(7) CHEW TABLET
Freq: Every day | ORAL | Status: DC
Start: 2016-10-17 — End: 2016-10-17

## 2016-10-17 MED ORDER — MEGESTROL 40 MG TAB
40 mg | ORAL_TABLET | Freq: Every day | ORAL | 0 refills | Status: DC
Start: 2016-10-17 — End: 2016-11-01

## 2016-10-17 MED ORDER — IBUPROFEN 800 MG TAB
800 mg | Freq: Four times a day (QID) | ORAL | Status: DC | PRN
Start: 2016-10-17 — End: 2016-10-17
  Administered 2016-10-17 (×2): via ORAL

## 2016-10-17 MED ORDER — SODIUM CHLORIDE 0.9 % IV
INTRAVENOUS | Status: DC | PRN
Start: 2016-10-17 — End: 2016-10-17

## 2016-10-17 MED FILL — ALPRAZOLAM 0.5 MG TAB: 0.5 mg | ORAL | Qty: 1

## 2016-10-17 MED FILL — IBUPROFEN 800 MG TAB: 800 mg | ORAL | Qty: 1

## 2016-10-17 MED FILL — ESCITALOPRAM 10 MG TAB: 10 mg | ORAL | Qty: 2

## 2016-10-17 MED FILL — SODIUM CHLORIDE 0.9 % IV: INTRAVENOUS | Qty: 1000

## 2016-10-17 MED FILL — MEGESTROL 40 MG TAB: 40 mg | ORAL | Qty: 1

## 2016-10-17 NOTE — Progress Notes (Signed)
Gynecology Progress Note    Patient doing well without significant complaints.  She states still having some bleeding but improved    Vitals:  Blood pressure 105/51, pulse 69, temperature 97.7 ??F (36.5 ??C), resp. rate 18, height 5\' 3"  (1.6 m), last menstrual period 10/06/2016, SpO2 99 %, not currently breastfeeding.  Temp (24hrs), Avg:98.3 ??F (36.8 ??C), Min:97.2 ??F (36.2 ??C), Max:99.1 ??F (37.3 ??C)        Exam:  Patient without distress.               Abdomen soft,  nontender.                   US Results (most recent):    Results from Hospital Encounter encounter on 10/16/16   US PELV NON OB W TV   Narrative Pelvic ultrasound dated 10/16/2016    CLINICAL INFORMATION: Vaginal bleeding    Transabdominal and transvaginal images.    Uterus measures 9.6 x 5 x 4.5 cm. There is a 3.6 x 3.1 x 2.9 cm fibroid.  Endometrial stripe is thick, 3.2 cm. Echogenicity is heterogenous. There is a  small amount of Doppler flow one or hypoechoic area noted. Small amount of fluid  in the endometrial canal.    Right ovary not visualized.    Left ovary measures 2.3 x 1.6 x 1.5 cm. Echogenicity unremarkable. There is  Doppler flow in the left ovary.         Impression IMPRESSION: Thickened and heterogenous endometrial region.      Lab/Data Review:  CBC:   Lab Results   Component Value Date/Time    WBC 6.2 10/17/2016 05:54 AM    HGB 8.9 (L) 10/17/2016 05:54 AM    HCT 26.4 (L) 10/17/2016 05:54 AM    PLT 110 (L) 10/17/2016 05:54 AM       Assessment and Plan:  Patient appears to be improving.  Will get 3rd unit as ordered check Hgb after if improved and bleeding still better will consider discharge for office workup and plan for definitive therapy.

## 2016-10-17 NOTE — Progress Notes (Signed)
Initial visit by chaplain to convey care and concern and encourage patient that chaplain services are available if desired. No needs were voiced during the visit. Provided business card for future reference.     Adrian Duckett, MDiv  Board Certified Chaplain

## 2016-10-17 NOTE — Progress Notes (Signed)
PRN ibuprofen given for cramping

## 2016-10-17 NOTE — Progress Notes (Signed)
Started last unit of blood PRBC,  no SOB or fatigue, mother at bedside.

## 2016-10-17 NOTE — Progress Notes (Signed)
Bleeding much improved  Hgb 10.5  Patient feels well and interested in discharge  Will discharge home on Megace and follow up with Dr Janice NorrieFrench  All questions answered.

## 2016-10-17 NOTE — Progress Notes (Signed)
Discharge orders received. Discharge instructions reviewed with patient. Gave pt time to voice concers/questions. No questions/concerns at this time. Pt excited to go home.

## 2016-10-17 NOTE — Progress Notes (Signed)
10/16/16 2022   Dual Skin Pressure Injury Assessment   Dual Skin Pressure Injury Assessment WDL   Second Care Provider (Based on Facility Policy) Laurette SchimkeMeaghan Flowers, RN   Skin Integumentary   Skin Integumentary (WDL) WDL

## 2016-10-17 NOTE — Progress Notes (Signed)
2nd unit of blood transfusion complete.

## 2016-10-17 NOTE — Progress Notes (Signed)
HOB elevated, no acute distress, ambulates to the bathroom independently stated" while urinating, vaginal bleeding with clots, no cramping. turns water red, moderate amount", no SOB, lightheadedness or fatigue, neurovascular checks WDL, pain level 0, no complaints of calf pain.

## 2016-10-17 NOTE — Progress Notes (Signed)
Pt states she has gone through 4 peri pads this evening, the first 2 being saturated, the second 2 being "much lighter". Pt states this is now the lightest the bleeding has been.

## 2016-10-17 NOTE — Progress Notes (Signed)
Lab contacted to draw Hg at 1800.

## 2016-10-17 NOTE — Progress Notes (Signed)
Pt taken via WC for discharge.

## 2016-10-17 NOTE — Discharge Summary (Signed)
Gynecology Discharge Summary     Name: April Gay MRN: [id number]  SSN: [id number]    Date of Birth: September 03, 1975  Age: 41 y.o.  Sex: female      Allergies: Review of patient's allergies indicates no known allergies.    Admit Date: 10/16/2016    Discharge Date: 10/17/2016      Admitting Physician: Vale HavenJason Alexander Hood, MD     Discharge Physician: Benedict Needyodd R Ivaan Liddy, MD     * Admission Diagnoses: DUB (dysfunctional uterine bleeding);DUB (dysfunctional ute*    * Discharge Diagnoses:   Hospital Problems as of 10/17/2016  Date Reviewed: 08/09/2016          Codes Class Noted - Resolved POA    * (Principal)DUB (dysfunctional uterine bleeding) ICD-10-CM: N93.8  ICD-9-CM: 626.8  10/16/2016 - Present Unknown        Iron deficiency anemia due to chronic blood loss ICD-10-CM: D50.0  ICD-9-CM: 280.0  10/16/2016 - Present Unknown               * Procedures: recieved 3 units PRBC and Megace    Consults: None    * Discharge Condition: good    * Hospital Course:   - Acute Menorrhagia                      - Patient responded to hormone therapy.     - Patient received transfusions of Packed Red Blood Cells.    * Discharge Disposition: Home    Discharge Medications:   Current Discharge Medication List      START taking these medications    Details   megestrol (MEGACE) 40 mg tablet Take 1 Tab by mouth daily.  Qty: 20 Tab, Refills: 0         CONTINUE these medications which have NOT CHANGED    Details   IBUPROFEN (MOTRIN PO) Take  by mouth as needed.      Noreth-Ethinyl Estradiol-Iron (ZENCHENT FE) 0.4mg -2235mcg(21) and 75 mg (7) chew Take 1 Tab by mouth daily. Indications: DYSMENORRHEA  Qty: 3 Package, Refills: 4      ALPRAZolam (XANAX) 0.5 mg tablet Take 1 Tab by mouth two (2) times daily as needed for Anxiety. Max Daily Amount: 1 mg.  Qty: 60 Tab, Refills: 5    Associated Diagnoses: Panic anxiety syndrome      escitalopram oxalate (LEXAPRO) 20 mg tablet Take 1 Tab by mouth daily.  Qty: 92 Tab, Refills: 1     Associated Diagnoses: Panic anxiety syndrome; Depression, recurrent ([facility]CC)              * Follow-up Care/Patient Instructions:  Activity: No sex, douching, or tampons for 6 weeks or as directed by your physician. No heavy lifting for 6 weeks. No driving while taking pain medication.  Diet: Resume pre-hospital diet  Wound Care: None needed    Follow-up Information     Follow up With Details Comments Contact Info    Renaldo FiddlerMichael L Peters, MD   [address]  [place] [id number]  [phone]            Signed By:  Benedict Needyodd R Mikaia Janvier, MD     October 17, 2016

## 2016-10-17 NOTE — Progress Notes (Signed)
2nd unit of blood transfusion started

## 2016-10-17 NOTE — Discharge Summary (Signed)
Gynecology Discharge Summary     Name: April Gay MRN: [id number]  SSN: [id number]    Date of Birth: 01/24/1976  Age: 41 y.o.  Sex: female      Allergies: Review of patient's allergies indicates no known allergies.    Admit Date: 10/16/2016    Discharge Date: 10/17/2016      Admitting Physician: Vale HavenJason Alexander Hood, MD     Discharge Physician: Benedict Needyodd R Gianlucca Szymborski, MD     * Admission Diagnoses: DUB (dysfunctional uterine bleeding);DUB (dysfunctional ute*    * Discharge Diagnoses:   Hospital Problems as of 10/17/2016  Date Reviewed: 08/09/2016          Codes Class Noted - Resolved POA    * (Principal)DUB (dysfunctional uterine bleeding) ICD-10-CM: N93.8  ICD-9-CM: 626.8  10/16/2016 - Present Unknown        Iron deficiency anemia due to chronic blood loss ICD-10-CM: D50.0  ICD-9-CM: 280.0  10/16/2016 - Present Unknown               * Procedures: recieved 3 units PRBC and Megace    Consults: None    * Discharge Condition: good    * Hospital Course:   - Acute Menorrhagia                      - Patient responded to hormone therapy.     - Patient received transfusions of Packed Red Blood Cells.    * Discharge Disposition: Home    Discharge Medications:   Current Discharge Medication List      START taking these medications    Details   megestrol (MEGACE) 40 mg tablet Take 1 Tab by mouth daily.  Qty: 20 Tab, Refills: 0         CONTINUE these medications which have NOT CHANGED    Details   IBUPROFEN (MOTRIN PO) Take  by mouth as needed.      Noreth-Ethinyl Estradiol-Iron (ZENCHENT FE) 0.4mg -6735mcg(21) and 75 mg (7) chew Take 1 Tab by mouth daily. Indications: DYSMENORRHEA  Qty: 3 Package, Refills: 4      ALPRAZolam (XANAX) 0.5 mg tablet Take 1 Tab by mouth two (2) times daily as needed for Anxiety. Max Daily Amount: 1 mg.  Qty: 60 Tab, Refills: 5    Associated Diagnoses: Panic anxiety syndrome      escitalopram oxalate (LEXAPRO) 20 mg tablet Take 1 Tab by mouth daily.  Qty: 92 Tab, Refills: 1    Associated Diagnoses: Panic anxiety  syndrome; Depression, recurrent (HCC)              * Follow-up Care/Patient Instructions:  Activity: No sex, douching, or tampons for 6 weeks or as directed by your physician. No heavy lifting for 6 weeks. No driving while taking pain medication.  Diet: Resume pre-hospital diet  Wound Care: None needed    Follow-up Information     Follow up With Details Comments Contact Info    Renaldo FiddlerMichael L Peters, MD   [address]  [place] [id number]  [phone]            Signed By:  Benedict Needyodd R Tunis Gentle, MD     October 17, 2016

## 2016-10-18 LAB — TYPE & SCREEN
ABO/Rh(D): O POS
Antibody screen: NEGATIVE
Status of unit: TRANSFUSED
Status of unit: TRANSFUSED
Status of unit: TRANSFUSED
Unit division: 0
Unit division: 0
Unit division: 0

## 2016-10-18 LAB — TYPE AND SCREEN
ABO/Rh: O POS
Antibody Screen: NEGATIVE
Status: TRANSFUSED
Status: TRANSFUSED
Status: TRANSFUSED
Unit Divison: 0
Unit Divison: 0
Unit Divison: 0

## 2016-10-18 NOTE — Telephone Encounter (Signed)
Please see ED note. Pt was d/charged yesterday by Dr Midge MiniumLantz. Please call patient

## 2016-10-21 ENCOUNTER — Ambulatory Visit
Admit: 2016-10-21 | Discharge: 2016-10-21 | Payer: BLUE CROSS/BLUE SHIELD | Attending: Obstetrics & Gynecology | Primary: Family Medicine

## 2016-10-21 DIAGNOSIS — N938 Other specified abnormal uterine and vaginal bleeding: Secondary | ICD-10-CM

## 2016-10-21 LAB — AMB POC URINE PREGNANCY TEST, VISUAL COLOR COMPARISON: HCG urine, Ql. (POC): NEGATIVE

## 2016-10-21 NOTE — Progress Notes (Signed)
ENDOMETRIAL BIOPSY    Date:  25-Dec-1975    Name:  Gevena CottonCindy D Pate     DOB:  25-Dec-1975    Age:  41 y.o.    Reason for procedure:  AUB and severe anemia        Speculum was inserted.  Cervix visualized and cleansed with betadine.  Single-tooth tenaculum was applied to anterior lip. Cervix was dilated. Curette was inserted through OS.  Uterus sounded to 9 cm.  Tissue obtained was adequate in amount.  Patient tolerated procedure well.        Abigail MiyamotoGlenn M Kenzli Barritt, MD

## 2016-10-25 LAB — SURGICAL PATHOLOGY

## 2016-10-26 ENCOUNTER — Encounter: Payer: BLUE CROSS/BLUE SHIELD | Attending: Obstetrics & Gynecology | Primary: Family Medicine

## 2016-10-26 NOTE — Telephone Encounter (Signed)
-----   Message from Greig RightAmanda L Kristle Wesch, RN sent at 10/26/2016  5:32 PM EDT -----  Per Dr. Janice NorrieFrench: Her endometrial biopsy is benign, so we can do regular hysterectomy

## 2016-10-26 NOTE — Telephone Encounter (Signed)
Patient notified of pathology results. She states she has noticed increased swelling in her ankles and wanted Dr. Janice NorrieFrench to know. Dr. Janice NorrieFrench notified and states ok to continue with surgery. Patient notified of this. She is to call back if sxs worsen. All questions answered and she voiced full understanding.

## 2016-10-26 NOTE — Progress Notes (Signed)
Per Dr. Janice NorrieFrench: Her endometrial biopsy is benign, so we can do regular hysterectomy

## 2016-11-01 MED ORDER — MEGESTROL 40 MG TAB
40 mg | ORAL_TABLET | Freq: Every day | ORAL | 0 refills | Status: DC
Start: 2016-11-01 — End: 2017-02-02

## 2016-11-01 NOTE — Telephone Encounter (Signed)
Patient called requesting a refill on Megace. Ok to refill per Dr. Janice NorrieFrench. Prescription sent to pharmacy. Patient notified.     Orders Placed This Encounter   ??? megestrol (MEGACE) 40 mg tablet     Sig: Take 1 Tab by mouth daily.     Dispense:  20 Tab     Refill:  0

## 2016-11-03 NOTE — Other (Signed)
Patient verified name, DOB, and surgery as listed in [facility].    Type 2 surgery    Labs per surgeon: No orders received.  Labs per anesthesia protocol: Hgb, T&S DOS, POC UHCG DOS; orders signed and held in [employer] [facility].   EKG: None.    Patient informed of GYN class on 11/11/16 at which time labs will be drawn. Patient will also receive all patient education and hibiclens soap to use per hospital policy.    Patient instructed to hold all vitamins 7 days prior to surgery and NSAIDS 5 days prior to surgery, patient verbalized understanding.    Patient instructed to continue previous medications as prescribed prior to surgery and to take the following medications the day of surgery according to anesthesia guidelines with a small sip of water: Xanax if needed, Lexapro, and Megace.     Patient answered medical/surgical history questions at their best of ability. All prior to admission medications documented in [facility].

## 2016-11-05 ENCOUNTER — Inpatient Hospital Stay: Primary: Family Medicine

## 2016-11-10 ENCOUNTER — Ambulatory Visit
Admit: 2016-11-10 | Discharge: 2016-11-10 | Payer: BLUE CROSS/BLUE SHIELD | Attending: Obstetrics & Gynecology | Primary: Family Medicine

## 2016-11-10 ENCOUNTER — Encounter: Payer: BLUE CROSS/BLUE SHIELD | Attending: Obstetrics & Gynecology | Primary: Family Medicine

## 2016-11-10 DIAGNOSIS — Z01818 Encounter for other preprocedural examination: Secondary | ICD-10-CM

## 2016-11-10 NOTE — H&P (Signed)
Subjective:     April Gay, MRN: [id number], is a 41 y.o. Caucasian female presents with AUB to the point she has had to go to the ER and be transfused. gradually worsening course. See office notes on  Care. Endometrial Bx was normal.    Patient Active Problem List    Diagnosis Date Noted   ??? DUB (dysfunctional uterine bleeding) 10/16/2016   ??? Iron deficiency anemia due to chronic blood loss 10/16/2016   ??? Weight gain, abnormal 07/21/2016   ??? Depression, recurrent (HCC) 11/19/2015   ??? Panic anxiety syndrome 11/19/2015   ??? Tobacco use disorder 11/19/2015   ??? Annual physical exam 11/19/2015     Past Medical History:   Diagnosis Date   ??? Anemia     daily iron supplement, seen in ER 09/2016  received 3 units PRBC and Megace    ??? Anxiety    ??? Current smoker    ??? Depression, recurrent (HCC)    ??? Menorrhagia with irregular cycle    ??? Weight gain, abnormal 07/21/2016      Past Surgical History:   Procedure Laterality Date   ??? HX CESAREAN SECTION      x2   ??? HX CYST REMOVAL     ??? HX GYN      fibroid removed    ??? HX WISDOM TEETH EXTRACTION        @PTAMEDS @  No Known Allergies   Social History   Substance Use Topics   ??? Smoking status: Current Every Day Smoker     Packs/day: 0.50     Years: 4.00     Start date: 05/19/2015   ??? Smokeless tobacco: Never Used   ??? Alcohol use Yes      Comment: socially       Family History   Problem Relation Age of Onset   ??? Depression Mother    ??? No Known Problems Son    ??? No Known Problems Daughter    ??? Breast Cancer Neg Hx         Prenatal Labs: No results found for: RUBELLAEXT, GRBSEXT, HBSAGEXT, HIVEXT, RPREXT, GONNOEXT, CHLAMEXT     Review of Systems  Constitutional: negative  Respiratory: negative  Cardiovascular: negative  Musculoskeletal:negative    Objective:     No data found.    No intake or output data in the 24 hours ending 11/10/16 1405  Visit Vitals   ??? LMP 11/09/2016 (Exact Date)     General appearance: alert, cooperative, no distress, appears stated age   Head: Normocephalic, without obvious abnormality, atraumatic  Back: symmetric, no curvature. ROM normal. No CVA tenderness.  Lungs: clear to auscultation bilaterally  Heart: regular rate and rhythm, S1, S2 normal, no murmur, click, rub or gallop  Abdomen:  benign  Pelvic: External genitalia normal, Vagina normal without discharge, cervix  Extremities: extremities normal, atraumatic, no cyanosis or edema  Pulses: 2+ and symmetric  Skin: Skin color, texture, turgor normal. No rashes or lesions      Assessment:         Menometrorrhagia and anemia, S/P transfusion.Discussed risks of infection, DVT, damage to bowel/bladder/other internal organs, bleeding/transfusion, scar tissue/adhesions. All questions answered, will proceed. No more children wanted.      Plan:     TLH/BS under general anesthesia

## 2016-11-10 NOTE — Progress Notes (Signed)
Preop Visit    Ms. April Gay presents for a preop visit.  She is scheduled for a Total Laparoscopic Hysterectomy and Bilateral Salpingectomy. Marland Kitchen  Her history, meds, and allergies were reviewed.  The procedure was reviewed in detail as well as the risks of bleeding, infection, DVT and potential surgical complications involving the bladder, ureters, colon or intestines.  Also the alternatives,  benefits, recovery and follow-up. Prevention of SSI discussed as indicated.  All of her questions were answered. No more children discussed.    Exam:  Lungs CTAB  Heart RRR    See the hospital H&P as well as prior chart for details.    Abigail Miyamoto, MD  November 10, 2016

## 2016-11-10 NOTE — Telephone Encounter (Signed)
AFTER HOURS CALL  Patient calls stating she has started with vaginal bleeding that is heavy again.  Scheduled for surgery 11/15/16.  Has pre-op 11/10/16 with Dr. Janice Norrie.  Advised patient change from using tampons to pads only.  If soaking one pad per hour X 2-3 hours, will need to go to ER @ [facility].  Call prn.  Patient voices understanding.

## 2016-11-11 ENCOUNTER — Inpatient Hospital Stay: Admit: 2016-11-11 | Payer: BLUE CROSS/BLUE SHIELD | Primary: Family Medicine

## 2016-11-11 DIAGNOSIS — Z01812 Encounter for preprocedural laboratory examination: Secondary | ICD-10-CM

## 2016-11-11 LAB — HEMOGLOBIN: HGB: 12.2 g/dL (ref 11.7–15.4)

## 2016-11-11 NOTE — Other (Signed)
HGB done today WNL    Recent Results (from the past 12 hour(s))   HEMOGLOBIN    Collection Time: 11/11/16 12:06 PM   Result Value Ref Range    HGB 12.2 11.7 - 15.4 g/dL

## 2016-11-11 NOTE — Progress Notes (Signed)
Patient attended GYN surgery orientation class today.  Detailed instruction book regarding GYN surgery was provided at start of class. Class content included pre-operative instructions for surgery in the week prior to and day before surgery. Packet including Hibiclens and instructions on bathing was provided to patient. Detailed information was given regarding arriving at the hospital and instructions for the patient's day of surgery.  Discussed recovery from surgery, hospital stay, pain management, and discharge.  Reviewed recovery at home including pelvic rest, driving and activity restrictions, issues requiring call to physician etc. Answered all questions in detail.  Patient voices understanding of all.

## 2016-11-15 ENCOUNTER — Inpatient Hospital Stay: Payer: BLUE CROSS/BLUE SHIELD

## 2016-11-15 ENCOUNTER — Observation Stay

## 2016-11-15 LAB — TYPE & SCREEN
ABO/Rh(D): O POS
Antibody screen: NEGATIVE

## 2016-11-15 LAB — METABOLIC PANEL, BASIC
Anion gap: 10 mmol/L (ref 7–16)
BUN: 8 MG/DL (ref 6–23)
CO2: 26 mmol/L (ref 21–32)
Calcium: 7.5 MG/DL — ABNORMAL LOW (ref 8.3–10.4)
Chloride: 106 mmol/L (ref 98–107)
Creatinine: 0.79 MG/DL (ref 0.6–1.0)
GFR est AA: >60 mL/min/{1.73_m2} (ref >60)
GFR est non-AA: >60 mL/min/{1.73_m2} (ref >60)
Glucose: 120 mg/dL — ABNORMAL HIGH (ref 65–100)
Potassium: 3.2 mmol/L — ABNORMAL LOW (ref 3.5–5.1)
Sodium: 142 mmol/L (ref 136–145)

## 2016-11-15 LAB — TYPE AND SCREEN
ABO/Rh: O POS
Antibody Screen: NEGATIVE

## 2016-11-15 LAB — HCG URINE, QL. - POC: Pregnancy test,urine (POC): NEGATIVE

## 2016-11-15 MED ORDER — KETOROLAC TROMETHAMINE 30 MG/ML INJECTION
30 mg/mL (1 mL) | INTRAMUSCULAR | Status: DC | PRN
Start: 2016-11-15 — End: 2016-11-15
  Administered 2016-11-15: 14:00:00 via INTRAVENOUS

## 2016-11-15 MED ORDER — LACTATED RINGERS IV
INTRAVENOUS | Status: DC
Start: 2016-11-15 — End: 2016-11-15

## 2016-11-15 MED ORDER — FENTANYL CITRATE (PF) 50 MCG/ML IJ SOLN
50 mcg/mL | INTRAMUSCULAR | Status: AC
Start: 2016-11-15 — End: ?

## 2016-11-15 MED ORDER — DOCUSATE SODIUM 100 MG CAP
100 mg | Freq: Two times a day (BID) | ORAL | Status: DC
Start: 2016-11-15 — End: 2016-11-16
  Administered 2016-11-15 – 2016-11-16 (×3): via ORAL

## 2016-11-15 MED ORDER — PROPOFOL 10 MG/ML IV EMUL
10 mg/mL | INTRAVENOUS | Status: AC
Start: 2016-11-15 — End: ?

## 2016-11-15 MED ORDER — LIDOCAINE (PF) 20 MG/ML (2 %) IV SYRINGE
100 mg/5 mL (2 %) | INTRAVENOUS | Status: AC
Start: 2016-11-15 — End: ?

## 2016-11-15 MED ORDER — LACTATED RINGERS IV
INTRAVENOUS | Status: DC
Start: 2016-11-15 — End: 2016-11-15
  Administered 2016-11-15 (×2): via INTRAVENOUS

## 2016-11-15 MED ORDER — GLYCOPYRROLATE 0.2 MG/ML IJ SOLN
0.2 mg/mL | INTRAMUSCULAR | Status: AC
Start: 2016-11-15 — End: ?

## 2016-11-15 MED ORDER — PHENYLEPHRINE 10 MG/ML INJECTION
10 mg/mL | INTRAMUSCULAR | Status: DC | PRN
Start: 2016-11-15 — End: 2016-11-15
  Administered 2016-11-15 (×2): via INTRAVENOUS

## 2016-11-15 MED ORDER — HYDROMORPHONE (PF) 2 MG/ML IJ SOLN
2 mg/mL | INTRAMUSCULAR | Status: DC | PRN
Start: 2016-11-15 — End: 2016-11-15
  Administered 2016-11-15 (×3): via INTRAVENOUS

## 2016-11-15 MED ORDER — EPHEDRINE SULFATE 50 MG/ML INJECTION SOLUTION
50 mg/mL | INTRAMUSCULAR | Status: AC
Start: 2016-11-15 — End: ?

## 2016-11-15 MED ORDER — SODIUM CHLORIDE 0.9 % IJ SYRG
Freq: Three times a day (TID) | INTRAMUSCULAR | Status: DC
Start: 2016-11-15 — End: 2016-11-15

## 2016-11-15 MED ORDER — SODIUM CHLORIDE 0.9 % IJ SYRG
Freq: Three times a day (TID) | INTRAMUSCULAR | Status: DC
Start: 2016-11-15 — End: 2016-11-16
  Administered 2016-11-16: 13:00:00 via INTRAVENOUS

## 2016-11-15 MED ORDER — ROCURONIUM 10 MG/ML IV
10 mg/mL | INTRAVENOUS | Status: DC | PRN
Start: 2016-11-15 — End: 2016-11-15
  Administered 2016-11-15: 12:00:00 via INTRAVENOUS

## 2016-11-15 MED ORDER — PROPOFOL 10 MG/ML IV EMUL
10 mg/mL | INTRAVENOUS | Status: DC | PRN
Start: 2016-11-15 — End: 2016-11-15
  Administered 2016-11-15: 12:00:00 via INTRAVENOUS

## 2016-11-15 MED ORDER — BUPIVACAINE (PF) 0.5 % (5 MG/ML) IJ SOLN
0.5 % (5 mg/mL) | INTRAMUSCULAR | Status: DC | PRN
Start: 2016-11-15 — End: 2016-11-15
  Administered 2016-11-15: 12:00:00 via SUBCUTANEOUS

## 2016-11-15 MED ORDER — MIDAZOLAM 1 MG/ML IJ SOLN
1 mg/mL | Freq: Once | INTRAMUSCULAR | Status: AC | PRN
Start: 2016-11-15 — End: 2016-11-15
  Administered 2016-11-15: 11:00:00 via INTRAVENOUS

## 2016-11-15 MED ORDER — CEFAZOLIN 2 GRAM/20 ML IN STERILE WATER INTRAVENOUS SYRINGE
2 gram/0 mL | Freq: Once | INTRAVENOUS | Status: AC
Start: 2016-11-15 — End: 2016-11-15
  Administered 2016-11-15: 12:00:00 via INTRAVENOUS

## 2016-11-15 MED ORDER — NS WITH POTASSIUM CHLORIDE 20 MEQ/L IV
20 mEq/L | Freq: Once | INTRAVENOUS | Status: AC
Start: 2016-11-15 — End: 2016-11-15
  Administered 2016-11-15: 18:00:00 via INTRAVENOUS

## 2016-11-15 MED ORDER — METHYLENE BLUE (ANTIDOTE) 5 MG/ML (0.5 %) INTRAVENOUS SOLUTION
5 mg/mL | INTRAVENOUS | Status: AC
Start: 2016-11-15 — End: ?

## 2016-11-15 MED ORDER — LIDOCAINE HCL 1 % (10 MG/ML) IJ SOLN
10 mg/mL (1 %) | INTRAMUSCULAR | Status: DC | PRN
Start: 2016-11-15 — End: 2016-11-15

## 2016-11-15 MED ORDER — SODIUM CHLORIDE 0.9 % INJECTION
INTRAMUSCULAR | Status: AC
Start: 2016-11-15 — End: ?

## 2016-11-15 MED ORDER — METHYLENE BLUE (ANTIDOTE) 1 % IJ SOLN
1 % (0 mg/mL) | INTRAVENOUS | Status: DC | PRN
Start: 2016-11-15 — End: 2016-11-15
  Administered 2016-11-15: 13:00:00

## 2016-11-15 MED ORDER — SODIUM CHLORIDE 0.9 % IJ SYRG
INTRAMUSCULAR | Status: DC | PRN
Start: 2016-11-15 — End: 2016-11-15

## 2016-11-15 MED ORDER — ROCURONIUM 10 MG/ML IV
10 mg/mL | INTRAVENOUS | Status: AC
Start: 2016-11-15 — End: ?

## 2016-11-15 MED ORDER — OXYCODONE-ACETAMINOPHEN 5 MG-325 MG TAB
5-325 mg | ORAL | Status: DC | PRN
Start: 2016-11-15 — End: 2016-11-15

## 2016-11-15 MED ORDER — IBUPROFEN 800 MG TAB
800 mg | Freq: Four times a day (QID) | ORAL | Status: DC | PRN
Start: 2016-11-15 — End: 2016-11-16

## 2016-11-15 MED ORDER — ONDANSETRON (PF) 4 MG/2 ML INJECTION
4 mg/2 mL | INTRAMUSCULAR | Status: DC | PRN
Start: 2016-11-15 — End: 2016-11-15
  Administered 2016-11-15: 13:00:00 via INTRAVENOUS

## 2016-11-15 MED ORDER — OXYCODONE-ACETAMINOPHEN 10 MG-325 MG TAB
10-325 mg | ORAL | Status: DC | PRN
Start: 2016-11-15 — End: 2016-11-16
  Administered 2016-11-15 – 2016-11-16 (×4): via ORAL

## 2016-11-15 MED ORDER — PHENYLEPHRINE 10 MG/ML INJECTION
10 mg/mL | INTRAMUSCULAR | Status: AC
Start: 2016-11-15 — End: ?

## 2016-11-15 MED ORDER — KETOROLAC TROMETHAMINE 30 MG/ML INJECTION
30 mg/mL (1 mL) | INTRAMUSCULAR | Status: AC
Start: 2016-11-15 — End: ?

## 2016-11-15 MED ORDER — EPHEDRINE SULFATE 50 MG/ML INJECTION SOLUTION
50 mg/mL | INTRAMUSCULAR | Status: DC | PRN
Start: 2016-11-15 — End: 2016-11-15
  Administered 2016-11-15 (×3): via INTRAVENOUS

## 2016-11-15 MED ORDER — GLYCOPYRROLATE 0.2 MG/ML IJ SOLN
0.2 mg/mL | INTRAMUSCULAR | Status: DC | PRN
Start: 2016-11-15 — End: 2016-11-15
  Administered 2016-11-15: 14:00:00 via INTRAVENOUS

## 2016-11-15 MED ORDER — DEXAMETHASONE SODIUM PHOSPHATE 4 MG/ML IJ SOLN
4 mg/mL | INTRAMUSCULAR | Status: DC | PRN
Start: 2016-11-15 — End: 2016-11-15
  Administered 2016-11-15: 12:00:00 via INTRAVENOUS

## 2016-11-15 MED ORDER — SODIUM CHLORIDE 0.9 % IJ SYRG
INTRAMUSCULAR | Status: DC | PRN
Start: 2016-11-15 — End: 2016-11-16

## 2016-11-15 MED ORDER — BUPIVACAINE (PF) 0.5 % (5 MG/ML) IJ SOLN
0.5 % (5 mg/mL) | INTRAMUSCULAR | Status: AC
Start: 2016-11-15 — End: ?

## 2016-11-15 MED ORDER — NALOXONE 0.4 MG/ML INJECTION
0.4 mg/mL | INTRAMUSCULAR | Status: DC | PRN
Start: 2016-11-15 — End: 2016-11-15

## 2016-11-15 MED ORDER — FAMOTIDINE 20 MG TAB
20 mg | Freq: Once | ORAL | Status: AC
Start: 2016-11-15 — End: 2016-11-15
  Administered 2016-11-15: 11:00:00 via ORAL

## 2016-11-15 MED ORDER — NEOSTIGMINE METHYLSULFATE 3 MG/3 ML (1 MG/ML) IV SYRINGE
3 mg/ mL (1 mg/mL) | INTRAVENOUS | Status: AC
Start: 2016-11-15 — End: ?

## 2016-11-15 MED ORDER — DIPHENHYDRAMINE 25 MG CAP
25 mg | ORAL | Status: DC | PRN
Start: 2016-11-15 — End: 2016-11-16

## 2016-11-15 MED ORDER — NEOSTIGMINE METHYLSULFATE 1 MG/ML INJECTION
1 mg/mL | INTRAMUSCULAR | Status: DC | PRN
Start: 2016-11-15 — End: 2016-11-15
  Administered 2016-11-15: 14:00:00 via INTRAVENOUS

## 2016-11-15 MED ORDER — NALOXONE 0.4 MG/ML INJECTION
0.4 mg/mL | INTRAMUSCULAR | Status: DC | PRN
Start: 2016-11-15 — End: 2016-11-16

## 2016-11-15 MED ORDER — FENTANYL CITRATE (PF) 50 MCG/ML IJ SOLN
50 mcg/mL | INTRAMUSCULAR | Status: DC | PRN
Start: 2016-11-15 — End: 2016-11-15
  Administered 2016-11-15 (×4): via INTRAVENOUS

## 2016-11-15 MED ORDER — ZOLPIDEM 5 MG TAB
5 mg | Freq: Every evening | ORAL | Status: DC | PRN
Start: 2016-11-15 — End: 2016-11-16
  Administered 2016-11-16: 03:00:00 via ORAL

## 2016-11-15 MED ORDER — PROMETHAZINE 25 MG TAB
25 mg | Freq: Four times a day (QID) | ORAL | Status: DC | PRN
Start: 2016-11-15 — End: 2016-11-16

## 2016-11-15 MED ORDER — LIDOCAINE (PF) 20 MG/ML (2 %) IJ SOLN
20 mg/mL (2 %) | INTRAMUSCULAR | Status: DC | PRN
Start: 2016-11-15 — End: 2016-11-15
  Administered 2016-11-15: 12:00:00 via INTRAVENOUS

## 2016-11-15 MED FILL — PHENYLEPHRINE 10 MG/ML INJECTION: 10 mg/mL | INTRAMUSCULAR | Qty: 1

## 2016-11-15 MED FILL — DOCUSATE SODIUM 100 MG CAP: 100 mg | ORAL | Qty: 1

## 2016-11-15 MED FILL — NS WITH POTASSIUM CHLORIDE 20 MEQ/L IV: 20 mEq/L | INTRAVENOUS | Qty: 1000

## 2016-11-15 MED FILL — PROVAYBLUE 5 MG/ML INTRAVENOUS SOLUTION: 5 mg/mL | INTRAVENOUS | Qty: 10

## 2016-11-15 MED FILL — NEOSTIGMINE METHYLSULFATE 3 MG/3 ML (1 MG/ML) IV SYRINGE: 3 mg/ mL (1 mg/mL) | INTRAVENOUS | Qty: 3

## 2016-11-15 MED FILL — OXYCODONE-ACETAMINOPHEN 10 MG-325 MG TAB: 10-325 mg | ORAL | Qty: 1

## 2016-11-15 MED FILL — PROPOFOL 10 MG/ML IV EMUL: 10 mg/mL | INTRAVENOUS | Qty: 20

## 2016-11-15 MED FILL — SODIUM CHLORIDE 0.9 % INJECTION: INTRAMUSCULAR | Qty: 20

## 2016-11-15 MED FILL — KETOROLAC TROMETHAMINE 30 MG/ML INJECTION: 30 mg/mL (1 mL) | INTRAMUSCULAR | Qty: 1

## 2016-11-15 MED FILL — FENTANYL CITRATE (PF) 50 MCG/ML IJ SOLN: 50 mcg/mL | INTRAMUSCULAR | Qty: 2

## 2016-11-15 MED FILL — FAMOTIDINE 20 MG TAB: 20 mg | ORAL | Qty: 1

## 2016-11-15 MED FILL — MIDAZOLAM 1 MG/ML IJ SOLN: 1 mg/mL | INTRAMUSCULAR | Qty: 2

## 2016-11-15 MED FILL — ROCURONIUM 10 MG/ML IV: 10 mg/mL | INTRAVENOUS | Qty: 5

## 2016-11-15 MED FILL — GLYCOPYRROLATE 0.2 MG/ML IJ SOLN: 0.2 mg/mL | INTRAMUSCULAR | Qty: 4

## 2016-11-15 MED FILL — EPHEDRINE SULFATE 50 MG/ML IJ SOLN: 50 mg/mL | INTRAMUSCULAR | Qty: 1

## 2016-11-15 MED FILL — CEFAZOLIN 2 GRAM/20 ML IN STERILE WATER INTRAVENOUS SYRINGE: 2 gram/0 mL | INTRAVENOUS | Qty: 20

## 2016-11-15 MED FILL — HYDROMORPHONE (PF) 2 MG/ML IJ SOLN: 2 mg/mL | INTRAMUSCULAR | Qty: 1

## 2016-11-15 MED FILL — SENSORCAINE-MPF 0.5 % (5 MG/ML) INJECTION SOLUTION: 0.5 % (5 mg/mL) | INTRAMUSCULAR | Qty: 30

## 2016-11-15 MED FILL — LIDOCAINE (PF) 20 MG/ML (2 %) IV SYRINGE: 100 mg/5 mL (2 %) | INTRAVENOUS | Qty: 5

## 2016-11-15 NOTE — Progress Notes (Signed)
Lexapro and pain medication given po.

## 2016-11-15 NOTE — Progress Notes (Signed)
Patient is requesting that she receive her lexapro tonight that it is not ordered.  Phone report for Dr Janice Norrie orders received for lexapro.

## 2016-11-15 NOTE — Progress Notes (Signed)
Report given to A. Hart Rochester RN, care relinquished.

## 2016-11-15 NOTE — Progress Notes (Signed)
Pt c/o increase pain  10 mg percocet given po

## 2016-11-15 NOTE — Progress Notes (Signed)
Received care of pt to 465 from PACU.  Pt a/o x 3.  Rates pain 2/10.  No vaginal bleeding noted.  SCD's in place bilaterally.  Oriented to room.  Pt's mother at bedside.  Pt instructed to call prn, verbalizes understanding.

## 2016-11-15 NOTE — Progress Notes (Signed)
C/o increase pain 4/10.  Percocet given po.

## 2016-11-15 NOTE — Anesthesia Post-Procedure Evaluation (Signed)
Post-Anesthesia Evaluation and Assessment    Patient: April Gay MRN: [id number]  SSN: [id number]    Date of Birth: [birth year]  Age: 41 y.o.  Sex: female       Cardiovascular Function/Vital Signs  Visit Vitals   ??? BP 103/54   ??? Pulse 91   ??? Temp 37.2 ??C (99 ??F)   ??? Resp 16   ??? Wt 59.9 kg (132 lb)   ??? SpO2 99%   ??? BMI 23.02 kg/m2       Patient is status post general anesthesia for Procedure(s):  HYSTERECTOMY TOTAL LAPAROSCOPIC WITH BILATERAL SALPINGECTOMY  CYSTOSCOPY.    Nausea/Vomiting: None    Postoperative hydration reviewed and adequate.    Pain:  Pain Scale 1: Numeric (0 - 10) (11/15/16 1021)  Pain Intensity 1: 3 (11/15/16 1021)   Managed    Neurological Status:   Neuro (WDL): Exceptions to WDL (11/15/16 0951)  Neuro  Neurologic State: Drowsy (11/15/16 8182)  Orientation Level: Oriented X4 (11/15/16 0951)  LUE Motor Response: Purposeful (11/15/16 0951)  LLE Motor Response: Purposeful (11/15/16 0951)  RUE Motor Response: Purposeful (11/15/16 0951)  RLE Motor Response: Purposeful (11/15/16 0951)   At baseline    Mental Status and Level of Consciousness: Arousable    Pulmonary Status:   O2 Device: Nasal cannula (11/15/16 1021)   Adequate oxygenation and airway patent    Complications related to anesthesia: None    Post-anesthesia assessment completed. No concerns. Doing well.     Signed By: Izola Price, MD     November 15, 2016

## 2016-11-15 NOTE — Anesthesia Pre-Procedure Evaluation (Addendum)
Anesthetic History   No history of anesthetic complications            Review of Systems / Medical History  Patient summary reviewed, nursing notes reviewed and pertinent labs reviewed    Pulmonary          Smoker         Neuro/Psych   Within defined limits          Comments: Depression/anxiety Cardiovascular                  Exercise tolerance: >4 METS     GI/Hepatic/Renal  Within defined limits              Endo/Other  Within defined limits           Other Findings            Physical Exam    Airway  Mallampati: II  TM Distance: 4 - 6 cm  Neck ROM: normal range of motion   Mouth opening: Normal     Cardiovascular    Rhythm: regular           Dental  No notable dental hx       Pulmonary                 Abdominal         Other Findings            Anesthetic Plan    ASA: 2  Anesthesia type: general          Induction: Intravenous  Anesthetic plan and risks discussed with: Patient

## 2016-11-15 NOTE — Progress Notes (Signed)
Shift assessment complete.  Patient has three small puncture incisions one at the umbilicus small amount of bruising  noted, number two groin on left side no bruising   or drainage noted, number three no bruising  or drainage noted, all areas are clean and dry. Foley patent with clear yellow urine in bag.  Iv infusing no signs of infltration

## 2016-11-15 NOTE — Progress Notes (Signed)
Patient back to bed after ambulating request pain medication at this time for pain level 5 states cramping in her abdomen.

## 2016-11-15 NOTE — Progress Notes (Signed)
Pt reports a decrease in pain

## 2016-11-15 NOTE — Other (Signed)
Notified Kim in lab of pending arrival of stat BMP.

## 2016-11-15 NOTE — Progress Notes (Signed)
IV site at left hand swollen and cool.  18 g IV dc'd with catheter tip intact.  18 g IV placed in right forearm, IVF's without difficulty.

## 2016-11-15 NOTE — Brief Op Note (Signed)
BRIEF OPERATIVE NOTE    Date of Procedure: 11/15/2016   Preoperative Diagnosis: Dysfunctional uterine bleeding [N93.8]  Abnormal uterine bleeding (AUB) [N93.9]  Severe anemia [D64.9]  Postoperative Diagnosis: Dysfunctional uterine bleeding [N93.8]    Procedure(s):  HYSTERECTOMY TOTAL LAPAROSCOPIC WITH BILATERAL SALPINGECTOMY  CYSTOSCOPY  Surgeon(s) and Role:     * Abigail Miyamoto, MD - Primary     * Tiffany Efrain Sella, MD - Assisting         Surgical Assistant: Al Corpus    Surgical Staff:  Circ-1: Richardean Chimera  Scrub Tech-1: Sharene Butters Casio  Scrub Tech-2: Alonza Bogus  Event Time In   Incision Start [phone]   Incision Close 0940     Anesthesia: General   Estimated Blood Loss: 100cc  Specimens:   ID Type Source Tests Collected by Time Destination   1 : UTERUS, CERVIX, BILATERAL FALLOPIAN TUBES Fresh Uterus with Bilateral Fallopian Tubes  Abigail Miyamoto, MD 11/15/2016 0914 Pathology      Findings: adenomyotic uterus, bladder adhesions, normal tubes and ovaries, very minimal endometriosis left cul-de-sac   Complications: adhesions uterus/bladder  Implants: * No implants in log *

## 2016-11-15 NOTE — Progress Notes (Signed)
Denies c/o pain or needs.  Husband at bedside.

## 2016-11-15 NOTE — Other (Signed)
Notified Jocelyn, RN of new orders r/t hypokalemia.

## 2016-11-15 NOTE — Progress Notes (Signed)
Patient up with assistance to ambulate.  Walked with assistance from her room 465 down L&D hall into MI[facility] then back , patient tolerated well.

## 2016-11-15 NOTE — Other (Signed)
TRANSFER - OUT REPORT:    Verbal report given to E[place], RN on April Gay  being transferred to 465(Mom/Baby) for routine post - op       Report consisted of patient???s Situation, Background, Assessment and   Recommendations(SBAR).     Information from the following report(s) OR Summary, Procedure Summary, Intake/Output and MAR was reviewed with the receiving nurse.    Opportunity for questions and clarification was provided.      Patient transported with:   O2 @ 2 liters

## 2016-11-15 NOTE — Progress Notes (Signed)
Pt reports a decrease in pain 2/10

## 2016-11-15 NOTE — Op Note (Signed)
[facility]  OPERATIVE REPORT    Name:Gay, April MANCEBO.  MR#: [id number]  DOB: 1975/10/23  ACCOUNT #: [id number]   DATE OF SERVICE: 11/15/2016    PROCEDURES PERFORMED:  Total laparoscopic hysterectomy and bilateral salpingectomy, observational cystoscopy.    PREOPERATIVE DIAGNOSIS:  Abnormal uterine bleeding, anemia requiring transfusion.    POSTOPERATIVE DIAGNOSIS:  Abnormal uterine bleeding, anemia requiring transfusion.    SURGEON:  Fernande Boyden, MD    ASSISTANT:  Georgiann Mohs, MD    ESTIMATED BLOOD LOSS:  100 mL.    COMPLICATIONS:  Bladder adhesions to the uterus.    IMPLANTS:  none    DRAIN:  Foley.    ANESTHESIA:  General.    TISSUE:  Uterus, cervix and bilateral fallopian tubes.       FINDINGS:  Very minimal endometriosis in the left cul-de-sac.    SPECIMENS REMOVED:  Uterus/cervix and bilateral tubes    PROCEDURE:  After informed consent, the patient was taken to the operating room and given general anesthesia.  She was prepped and draped in the usual sterile fashion in the dorsal lithotomy position.  Examination under anesthesia was carried out.  Laparoscopy was begun by infiltrating the lower umbilical area with dilute Marcaine, and then a 5 mm incision was made with a knife.  Through this, the Veress needle was passed into the abdominal cavity and 3.5 liters of CO2 was infiltrated.  The Veress needle was withdrawn and the 5 mm disposable trocar was placed through the incision.  The trocar obturator was removed, and the laparoscope was placed through the sleeve.  It was deemed impossible to continue with laparoscopic approach.  Therefore, a 5 mm port was placed in the right lower quadrant and a 10 mm port placed in the left lower quadrant under direct visualization.  Through these two lateral ports Ace Harmonic and grasping instrument were passed back and forth.  The left fallopian tube was elevated and cauterized as the mesosalpinx was cut and also the right fallopian  tube was treated the same with these tubes being removed through the left lower quadrant port.  The Ace Harmonic was then used to cauterize and cut the left uteroovarian ligament as well as the broad ligament and round ligament.  Same was carried on the right side.  There were some very dense adhesions from previous C-section, and the bladder was scarred down to the lower uterine segment and over the cervix.  Some of his work is very tedious as the bladder was carefully taken off of this area and the adhesions were broken down.  We used a cotton dissection ball as well.  Once this was pushed away from the uterine vessels, these vessels were cauterized and cut.  Again, the bladder was pushed away from the cervix as a sponge on a stick was placed in the vagina to identify the anterior vaginal fornix.  Once this was accomplished, the Ace Harmonic was used to then perform a colpotomy anteriorly and then also in the posterior fornix.  These 2 areas were then connected in a circumferential fashion cutting around the vagina, relieving it from the cervix.  The cervix and uterus were then removed through the vaginal opening, an Asepto bulb was placed in the vagina to maintain pneumoperitoneum.  At this time, irrigation was carried out, and it was found to be hemostatic, so the cuff was closed with extracorporal 2-0 Vicryl sutures, these were placed in the angles incorporating the uterosacral ligaments.  Then, the cuff was closed in anterior, posterior fashion with interrupted sutures of 2-0 Vicryl.  An observational cystoscopy was then performed by infiltrating about 250 mL of irrigation into the bladder, and then the laparoscope was placed through the urethra.  Both ureters were seen to be effluxing urine rather quickly and there was no internal damage, no sutures were seen.  The laparoscope was then removed, and then placed back through the umbilical port.  During this time, there was no bleeding as the abdominal pressure  was desufflated.  Final irrigation was carried out, and good hemostasis was noted, so the procedure was terminated as the CO2 was allowed to escape, the ports were removed under direct visualization.  The Asepto bulb had been removed prior to cystoscopy.  The ports were closed at the skin layer with 4-0 Monocryl subcuticular stitch and then sealed off with Dermabond.  The counts were correct x2 and the patient went to recovery room in stable condition.      Tashira Torre M. Janice Norrie, MD       GMF / DN  D: 11/15/2016 14:47     T: 11/16/2016 07:27  JOB #: [id number]

## 2016-11-16 LAB — HGB & HCT
HCT: 28.5 % — ABNORMAL LOW (ref 35.8–46.3)
HGB: 8.9 g/dL — ABNORMAL LOW (ref 11.7–15.4)

## 2016-11-16 MED ORDER — OXYCODONE-ACETAMINOPHEN 10 MG-325 MG TAB
10-325 mg | ORAL_TABLET | ORAL | 0 refills | Status: DC | PRN
Start: 2016-11-16 — End: 2017-02-02

## 2016-11-16 MED ORDER — IBUPROFEN 800 MG TAB
800 mg | ORAL_TABLET | Freq: Three times a day (TID) | ORAL | 1 refills | Status: DC | PRN
Start: 2016-11-16 — End: 2017-08-02

## 2016-11-16 MED ORDER — ESCITALOPRAM 10 MG TAB
10 mg | Freq: Every evening | ORAL | Status: DC
Start: 2016-11-16 — End: 2016-11-16
  Administered 2016-11-16: 01:00:00 via ORAL

## 2016-11-16 MED FILL — KETOROLAC TROMETHAMINE 30 MG/ML INJECTION: 30 mg/mL (1 mL) | INTRAMUSCULAR | Qty: 1

## 2016-11-16 MED FILL — OXYCODONE-ACETAMINOPHEN 10 MG-325 MG TAB: 10-325 mg | ORAL | Qty: 1

## 2016-11-16 MED FILL — DEXAMETHASONE SODIUM PHOSPHATE 4 MG/ML IJ SOLN: 4 mg/mL | INTRAMUSCULAR | Qty: 4

## 2016-11-16 MED FILL — GLYCOPYRROLATE 0.2 MG/ML IJ SOLN: 0.2 mg/mL | INTRAMUSCULAR | Qty: 0.4

## 2016-11-16 MED FILL — ONDANSETRON (PF) 4 MG/2 ML INJECTION: 4 mg/2 mL | INTRAMUSCULAR | Qty: 4

## 2016-11-16 MED FILL — PHENYLEPHRINE 10 MG/ML INJECTION: 10 mg/mL | INTRAMUSCULAR | Qty: 100

## 2016-11-16 MED FILL — PROPOFOL 10 MG/ML IV EMUL: 10 mg/mL | INTRAVENOUS | Qty: 200

## 2016-11-16 MED FILL — EPHEDRINE SULFATE 50 MG/ML INTRAVENOUS SOLUTION: 50 mg/mL | INTRAVENOUS | Qty: 30

## 2016-11-16 MED FILL — XYLOCAINE-MPF 20 MG/ML (2 %) INJECTION SOLUTION: 20 mg/mL (2 %) | INTRAMUSCULAR | Qty: 80

## 2016-11-16 MED FILL — DOCUSATE SODIUM 100 MG CAP: 100 mg | ORAL | Qty: 1

## 2016-11-16 MED FILL — NEOSTIGMINE METHYLSULFATE 3 MG/3 ML (1 MG/ML) IV SYRINGE: 3 mg/ mL (1 mg/mL) | INTRAVENOUS | Qty: 3

## 2016-11-16 MED FILL — ROCURONIUM 10 MG/ML IV: 10 mg/mL | INTRAVENOUS | Qty: 40

## 2016-11-16 MED FILL — ESCITALOPRAM 10 MG TAB: 10 mg | ORAL | Qty: 1

## 2016-11-16 MED FILL — ZOLPIDEM 5 MG TAB: 5 mg | ORAL | Qty: 1

## 2016-11-16 NOTE — Op Note (Signed)
[facility]  OPERATIVE REPORT    Name:April Gay, April Gay.  MR#: [id number]  DOB: August 30, 1975  ACCOUNT #: [id number]   DATE OF SERVICE: 11/15/2016    PROCEDURES PERFORMED:  Total laparoscopic hysterectomy and bilateral salpingectomy, observational cystoscopy.    PREOPERATIVE DIAGNOSIS:  Abnormal uterine bleeding, anemia requiring transfusion.    POSTOPERATIVE DIAGNOSIS:  Abnormal uterine bleeding, anemia requiring transfusion.    SURGEON:  Fernande Boyden, MD    ASSISTANT:  Georgiann Mohs, MD    ESTIMATED BLOOD LOSS:  100 mL.    COMPLICATIONS:  Bladder adhesions to the uterus.    IMPLANTS:  none    DRAIN:  Foley.    ANESTHESIA:  General.    TISSUE:  Uterus, cervix and bilateral fallopian tubes.       FINDINGS:  Very minimal endometriosis in the left cul-de-sac.    SPECIMENS REMOVED:  Uterus/cervix and bilateral tubes    PROCEDURE:  After informed consent, the patient was taken to the operating room and given general anesthesia.  She was prepped and draped in the usual sterile fashion in the dorsal lithotomy position.  Examination under anesthesia was carried out.  Laparoscopy was begun by infiltrating the lower umbilical area with dilute Marcaine, and then a 5 mm incision was made with a knife.  Through this, the Veress needle was passed into the abdominal cavity and 3.5 liters of CO2 was infiltrated.  The Veress needle was withdrawn and the 5 mm disposable trocar was placed through the incision.  The trocar obturator was removed, and the laparoscope was placed through the sleeve.  It was deemed impossible to continue with laparoscopic approach.  Therefore, a 5 mm port was placed in the right lower quadrant and a 10 mm port placed in the left lower quadrant under direct visualization.  Through these two lateral ports Ace Harmonic and grasping instrument were passed back and forth.  The left fallopian tube was elevated and cauterized as the mesosalpinx was cut and also the right  fallopian tube was treated the same with these tubes being removed through the left lower quadrant port.  The Ace Harmonic was then used to cauterize and cut the left uteroovarian ligament as well as the broad ligament and round ligament.  Same was carried on the right side.  There were some very dense adhesions from previous C-section, and the bladder was scarred down to the lower uterine segment and over the cervix.  Some of his work is very tedious as the bladder was carefully taken off of this area and the adhesions were broken down.  We used a cotton dissection ball as well.  Once this was pushed away from the uterine vessels, these vessels were cauterized and cut.  Again, the bladder was pushed away from the cervix as a sponge on a stick was placed in the vagina to identify the anterior vaginal fornix.  Once this was accomplished, the Ace Harmonic was used to then perform a colpotomy anteriorly and then also in the posterior fornix.  These 2 areas were then connected in a circumferential fashion cutting around the vagina, relieving it from the cervix.  The cervix and uterus were then removed through the vaginal opening, an Asepto bulb was placed in the vagina to maintain pneumoperitoneum.  At this time, irrigation was carried out, and it was found to be hemostatic, so the cuff was closed with extracorporal 2-0 Vicryl sutures, these were placed in the angles incorporating the uterosacral ligaments.  Then, the cuff was closed in anterior, posterior fashion with interrupted sutures of 2-0 Vicryl.  An observational cystoscopy was then performed by infiltrating about 250 mL of irrigation into the bladder, and then the laparoscope was placed through the urethra.  Both ureters were seen to be effluxing urine rather quickly and there was no internal damage, no sutures were seen.  The laparoscope was then removed, and then placed back through the umbilical  port.  During this time, there was no bleeding as the abdominal pressure was desufflated.  Final irrigation was carried out, and good hemostasis was noted, so the procedure was terminated as the CO2 was allowed to escape, the ports were removed under direct visualization.  The Asepto bulb had been removed prior to cystoscopy.  The ports were closed at the skin layer with 4-0 Monocryl subcuticular stitch and then sealed off with Dermabond.  The counts were correct x2 and the patient went to recovery room in stable condition.      Braycen Burandt M. Janice NorrieFRENCH, MD       GMF / DN  D: 11/15/2016 14:47     T: 11/16/2016 07:27  JOB #: [id number]

## 2016-11-16 NOTE — Progress Notes (Signed)
SBAR report received from Maryann AlarAngela Lawson, RN. Patient care assumed.

## 2016-11-16 NOTE — Progress Notes (Signed)
Patient with eyes closed VS taken ,  Foley cath emptied for 700cc's clear yellow urine.

## 2016-11-16 NOTE — Progress Notes (Signed)
Patient with eyes closed no complaints at this time,

## 2016-11-16 NOTE — Progress Notes (Signed)
SBAR report given to Kristen Casteel, RN. Patient care relinquished.

## 2016-11-16 NOTE — Discharge Summary (Signed)
[facility] Discharge Summary     Patient ID:  April Gay  [id number]  41 y.o.  01-17-1976    Admit date: 11/15/2016    Discharge date and time: No discharge date for patient encounter.     Admitting Physician: Abigail MiyamotoGlenn M Brittain Hosie, MD     Discharge Physician: Cheri FowlerGlenn Nigeria Lasseter, M.D.    Admission Diagnoses: Dysfunctional uterine bleeding [N93.8];Abnormal uterine ble*    Problem List: Hospital - Principal Problem:    S/P laparoscopic hysterectomy (11/15/2016)    Active Problems:    Abnormal uterine bleeding (AUB) (11/15/2016)     ; Other -   Patient Active Problem List   Diagnosis Code   ??? Depression, recurrent (HCC) F33.9   ??? Panic anxiety syndrome F41.0   ??? Tobacco use disorder F17.200   ??? Annual physical exam Z00.00   ??? Weight gain, abnormal R63.5   ??? DUB (dysfunctional uterine bleeding) N93.8   ??? Iron deficiency anemia due to chronic blood loss D50.0   ??? Abnormal uterine bleeding (AUB) N93.9   ??? S/P laparoscopic hysterectomy Z90.710        Discharge Diagnoses: Dysfunctional uterine bleeding [N93.8];Abnormal uterine ble*    Hospital Course: April Gay had unremarkeable progressive recovery.  and Eating, ambulating, and voiding in a routine manner.    Significant Diagnostic Studies:   Recent Results (from the past 24 hour(s))   METABOLIC PANEL, BASIC    Collection Time: 11/15/16 10:29 AM   Result Value Ref Range    Sodium 142 136 - 145 mmol/L    Potassium 3.2 (L) 3.5 - 5.1 mmol/L    Chloride 106 98 - 107 mmol/L    CO2 26 21 - 32 mmol/L    Anion gap 10 7 - 16 mmol/L    Glucose 120 (H) 65 - 100 mg/dL    BUN 8 6 - 23 MG/DL    Creatinine 4.090.79 0.6 - 1.0 MG/DL    GFR est AA >81>60 >19>60 JY/NWG/9.56O1ml/min/1.73m2    GFR est non-AA >60 >60 ml/min/1.3573m2    Calcium 7.5 (L) 8.3 - 10.4 MG/DL   HGB & HCT    Collection Time: 11/16/16  6:14 AM   Result Value Ref Range    HGB 8.9 (L) 11.7 - 15.4 g/dL    HCT 30.828.5 (L) 65.735.8 - 46.3 %       Procedures: Total Laparoscopic Hysterectomy without Salpingo-Oophorectomy  Bilateral Salpingectomy    Discharge Exam:   Visit Vitals   ??? BP 123/61   ??? Pulse 62   ??? Temp 99.7 ??F (37.6 ??C)   ??? Resp 17   ??? Wt 132 lb (59.9 kg)   ??? LMP 11/09/2016 (Exact Date)   ??? SpO2 98%   ??? BMI 23.02 kg/m2      Heart: regular rate and rhythm, S1, S2 normal, no murmur, click, rub or gallop  Lungs: clear to auscultation bilaterally  Extremities: normal, atraumatic, no cyanosis or edema, no DVT  Incision: no significant drainage, no dehiscence, no significant erythema    Patient Instructions:   Current Discharge Medication List      START taking these medications    Details   oxyCODONE-acetaminophen (PERCOCET 10) 10-325 mg per tablet Take 1 Tab by mouth every four (4) hours as needed. Max Daily Amount: 6 Tabs. Indications: Pain  Qty: 20 Tab, Refills: 0    Associated Diagnoses: Abnormal uterine bleeding (AUB)         CONTINUE these medications which have CHANGED  Details   ibuprofen (MOTRIN) 800 mg tablet Take 1 Tab by mouth every eight (8) hours as needed.  Qty: 40 Tab, Refills: 1    Associated Diagnoses: Abnormal uterine bleeding (AUB)         CONTINUE these medications which have NOT CHANGED    Details   megestrol (MEGACE) 40 mg tablet Take 1 Tab by mouth daily.  Qty: 20 Tab, Refills: 0      ferrous sulfate (IRON) 325 mg (65 mg iron) tablet Take  by mouth Daily (before breakfast).      ALPRAZolam (XANAX) 0.5 mg tablet Take 1 Tab by mouth two (2) times daily as needed for Anxiety. Max Daily Amount: 1 mg.  Qty: 60 Tab, Refills: 5    Associated Diagnoses: Panic anxiety syndrome      escitalopram oxalate (LEXAPRO) 20 mg tablet Take 1 Tab by mouth daily.  Qty: 92 Tab, Refills: 1    Associated Diagnoses: Panic anxiety syndrome; Depression, recurrent (HCC)         STOP taking these medications       Noreth-Ethinyl Estradiol-Iron (ZENCHENT FE) 0.4mg -5mcg(21) and 75 mg (7) chew Comments:   Reason for Stopping:              Activity: physical activity is restricted per discharge instructions  Diet: resume normal diet   Wound Care: Keep wound clean and dry, Reinforce dressing PRN and As directed    Follow-up with Abigail Miyamoto, MD in 2 weeks.    Signed:  Abigail Miyamoto, MD  11/16/2016  10:15 AM

## 2016-11-16 NOTE — Progress Notes (Addendum)
0700 Bedside and Verbal shift change report given to K. Casteel RN (Cabin crewoncoming nurse) by Lemont Fillersatyana RN (offgoing nurse). Report included the following information SBAR.   [phone] [id number] Shift assessment completed. Pt. Pain 6/10. Medication given. See MAR. Pt. States she has voided x2 but not in the hat. Hat placed in toilet. Teaching given. Hibiclens provided. Teaching given. Pt. Is eating a regular diet. Pt. States she is up out of bed often. SCD's off at this time. Pt. Encouraged to move every 30 min.  1012 Dr. Janice NorrieFrench at bedside for pt. Assessment. Discharge to home orders received. Discharge instructions explained to pt. Per Dr. Janice NorrieFrench. PIV removed per RN. Dr. Janice NorrieFrench updated on pt. Temp. No new orders received. Pt. Instructed to take her temp everyday and call Dr. Leander RamsFrench's office if above 100.4.   1020 I have reviewed discharge instructions with the patient.  The patient verbalized understanding. Pt. D/c to home is stable condition. Rx given.

## 2016-11-16 NOTE — Discharge Summary (Signed)
[facility] Discharge Summary     Patient ID:  April Gay  [id number]  41 y.o.  05/06/1975    Admit date: 11/15/2016    Discharge date and time: No discharge date for patient encounter.     Admitting Physician: Abigail MiyamotoGlenn M Guiliana Shor, MD     Discharge Physician: Cheri FowlerGlenn Alexandera Kuntzman, M.D.    Admission Diagnoses: Dysfunctional uterine bleeding [N93.8];Abnormal uterine ble*    Problem List: Hospital - Principal Problem:    S/P laparoscopic hysterectomy (11/15/2016)    Active Problems:    Abnormal uterine bleeding (AUB) (11/15/2016)     ; Other -   Patient Active Problem List   Diagnosis Code   ??? Depression, recurrent (HCC) F33.9   ??? Panic anxiety syndrome F41.0   ??? Tobacco use disorder F17.200   ??? Annual physical exam Z00.00   ??? Weight gain, abnormal R63.5   ??? DUB (dysfunctional uterine bleeding) N93.8   ??? Iron deficiency anemia due to chronic blood loss D50.0   ??? Abnormal uterine bleeding (AUB) N93.9   ??? S/P laparoscopic hysterectomy Z90.710        Discharge Diagnoses: Dysfunctional uterine bleeding [N93.8];Abnormal uterine ble*    Hospital Course: April Gay had unremarkeable progressive recovery.  and Eating, ambulating, and voiding in a routine manner.    Significant Diagnostic Studies:   Recent Results (from the past 24 hour(s))   METABOLIC PANEL, BASIC    Collection Time: 11/15/16 10:29 AM   Result Value Ref Range    Sodium 142 136 - 145 mmol/L    Potassium 3.2 (L) 3.5 - 5.1 mmol/L    Chloride 106 98 - 107 mmol/L    CO2 26 21 - 32 mmol/L    Anion gap 10 7 - 16 mmol/L    Glucose 120 (H) 65 - 100 mg/dL    BUN 8 6 - 23 MG/DL    Creatinine 4.090.79 0.6 - 1.0 MG/DL    GFR est AA >81>60 >19>60 JY/NWG/9.56O1ml/min/1.73m2    GFR est non-AA >60 >60 ml/min/1.2873m2    Calcium 7.5 (L) 8.3 - 10.4 MG/DL   HGB & HCT    Collection Time: 11/16/16  6:14 AM   Result Value Ref Range    HGB 8.9 (L) 11.7 - 15.4 g/dL    HCT 30.828.5 (L) 65.735.8 - 46.3 %       Procedures: Total Laparoscopic Hysterectomy without Salpingo-Oophorectomy  Bilateral Salpingectomy    Discharge  Exam:  Visit Vitals   ??? BP 123/61   ??? Pulse 62   ??? Temp 99.7 ??F (37.6 ??C)   ??? Resp 17   ??? Wt 132 lb (59.9 kg)   ??? LMP 11/09/2016 (Exact Date)   ??? SpO2 98%   ??? BMI 23.02 kg/m2      Heart: regular rate and rhythm, S1, S2 normal, no murmur, click, rub or gallop  Lungs: clear to auscultation bilaterally  Extremities: normal, atraumatic, no cyanosis or edema, no DVT  Incision: no significant drainage, no dehiscence, no significant erythema    Patient Instructions:   Current Discharge Medication List      START taking these medications    Details   oxyCODONE-acetaminophen (PERCOCET 10) 10-325 mg per tablet Take 1 Tab by mouth every four (4) hours as needed. Max Daily Amount: 6 Tabs. Indications: Pain  Qty: 20 Tab, Refills: 0    Associated Diagnoses: Abnormal uterine bleeding (AUB)         CONTINUE these medications which have CHANGED  Details   ibuprofen (MOTRIN) 800 mg tablet Take 1 Tab by mouth every eight (8) hours as needed.  Qty: 40 Tab, Refills: 1    Associated Diagnoses: Abnormal uterine bleeding (AUB)         CONTINUE these medications which have NOT CHANGED    Details   megestrol (MEGACE) 40 mg tablet Take 1 Tab by mouth daily.  Qty: 20 Tab, Refills: 0      ferrous sulfate (IRON) 325 mg (65 mg iron) tablet Take  by mouth Daily (before breakfast).      ALPRAZolam (XANAX) 0.5 mg tablet Take 1 Tab by mouth two (2) times daily as needed for Anxiety. Max Daily Amount: 1 mg.  Qty: 60 Tab, Refills: 5    Associated Diagnoses: Panic anxiety syndrome      escitalopram oxalate (LEXAPRO) 20 mg tablet Take 1 Tab by mouth daily.  Qty: 92 Tab, Refills: 1    Associated Diagnoses: Panic anxiety syndrome; Depression, recurrent (HCC)         STOP taking these medications       Noreth-Ethinyl Estradiol-Iron (ZENCHENT FE) 0.4mg -77mcg(21) and 75 mg (7) chew Comments:   Reason for Stopping:              Activity: physical activity is restricted per discharge instructions  Diet: resume normal diet  Wound Care: Keep wound clean and  dry, Reinforce dressing PRN and As directed    Follow-up with Abigail Miyamoto, MD in 2 weeks.    Signed:  Abigail Miyamoto, MD  11/16/2016  10:15 AM

## 2016-11-18 ENCOUNTER — Encounter: Primary: Family Medicine

## 2016-11-24 LAB — HCG URINE, QL. - POC: Pregnancy test,urine (POC): NEGATIVE

## 2016-11-25 ENCOUNTER — Encounter: Attending: Obstetrics & Gynecology | Primary: Family Medicine

## 2016-11-25 ENCOUNTER — Encounter: Primary: Family Medicine

## 2016-12-05 ENCOUNTER — Ambulatory Visit
Admit: 2016-12-05 | Discharge: 2016-12-05 | Payer: BLUE CROSS/BLUE SHIELD | Attending: Obstetrics & Gynecology | Primary: Family Medicine

## 2016-12-05 DIAGNOSIS — Z130 Encounter for screening for diseases of the blood and blood-forming organs and certain disorders involving the immune mechanism: Secondary | ICD-10-CM

## 2016-12-05 LAB — AMB POC HEMOGLOBIN (HGB): Hemoglobin (POC): 12.9

## 2016-12-05 NOTE — Progress Notes (Signed)
April Gay presents for postop visit from Total Laparoscopic Hysterectomy and Bilateral Salpingectomy about 3 weeks ago.  Doing well; no concerns.without any bleeding.  Fever: NO Voiding well: YES.  Bowel movements OK: YES.     Exam: A&OX3, NAD.  Abdomen:  Non tender Incisions clean, dry, intact    Discussed pathology report which was benign    A/P.  Stable Post op condition.  Gradually increase activity. Resumption of sexual activity is not encouraged at this time.  Follow up 3 weeks.

## 2016-12-06 ENCOUNTER — Encounter

## 2016-12-22 ENCOUNTER — Ambulatory Visit
Admit: 2016-12-22 | Discharge: 2016-12-22 | Payer: BLUE CROSS/BLUE SHIELD | Attending: Obstetrics & Gynecology | Primary: Family Medicine

## 2016-12-22 DIAGNOSIS — Z9889 Other specified postprocedural states: Secondary | ICD-10-CM

## 2016-12-22 NOTE — Progress Notes (Signed)
April Gay presents for postop visit from Total Laparoscopic Hysterectomy and Bilateral Salpingectomy about 6 weeks ago.  Doing well; no concerns.without any bleeding.  Fever: NO Voiding well: YES.  Bowel movements OK: YES.     Exam: A&OX3, NAD.  Abdomen:  Non tender Incisions clean, dry, intact. Vaginal cuff almost healed.looks healthy overall, will wait one more weeks for sex    Discussed pathology report which was benign    A/P.  Stable Post op condition.  Gradually increase activity. Resumption of sexual activity is encouraged 1 week.  Follow up 1 year

## 2017-01-26 ENCOUNTER — Other Ambulatory Visit: Admit: 2017-01-26 | Discharge: 2017-01-26 | Payer: BLUE CROSS/BLUE SHIELD | Primary: Family Medicine

## 2017-01-26 DIAGNOSIS — Z0000 Encounter for general adult medical examination without abnormal findings: Secondary | ICD-10-CM

## 2017-01-26 LAB — AMB POC GLUCOSE, QUANTITATIVE, BLOOD: Glucose POC: 82 mg/dL (ref 60–99)

## 2017-01-27 LAB — LIPID PANEL
Cholesterol, total: 204 mg/dL — ABNORMAL HIGH (ref 100–199)
HDL Cholesterol: 89 mg/dL (ref >39)
LDL, calculated: 101 mg/dL — ABNORMAL HIGH (ref 0–99)
Triglyceride: 68 mg/dL (ref 0–149)
VLDL, calculated: 14 mg/dL (ref 5–40)

## 2017-01-27 LAB — CBC WITH AUTOMATED DIFF
ABS. BASOPHILS: 0 10*3/uL (ref 0.0–0.2)
ABS. EOSINOPHILS: 0.1 10*3/uL (ref 0.0–0.4)
ABS. IMM. GRANS.: 0 10*3/uL (ref 0.0–0.1)
ABS. MONOCYTES: 0.4 10*3/uL (ref 0.1–0.9)
ABS. NEUTROPHILS: 3.4 10*3/uL (ref 1.4–7.0)
Abs Lymphocytes: 1.9 10*3/uL (ref 0.7–3.1)
BASOPHILS: 0 %
EOSINOPHILS: 1 %
HCT: 40.5 % (ref 34.0–46.6)
HGB: 13.6 g/dL (ref 11.1–15.9)
IMMATURE GRANULOCYTES: 0 %
Lymphocytes: 32 %
MCH: 29.8 pg (ref 26.6–33.0)
MCHC: 33.6 g/dL (ref 31.5–35.7)
MCV: 89 fL (ref 79–97)
MONOCYTES: 7 %
NEUTROPHILS: 60 %
PLATELET: 227 10*3/uL (ref 150–379)
RBC: 4.57 x10E6/uL (ref 3.77–5.28)
RDW: 15.4 % (ref 12.3–15.4)
WBC: 5.9 10*3/uL (ref 3.4–10.8)

## 2017-01-27 LAB — TSH REFLEX TO T4: TSH: 1.83 u[IU]/mL (ref 0.450–4.500)

## 2017-02-02 ENCOUNTER — Ambulatory Visit
Admit: 2017-02-02 | Discharge: 2017-02-02 | Payer: BLUE CROSS/BLUE SHIELD | Attending: Family Medicine | Primary: Family Medicine

## 2017-02-02 DIAGNOSIS — Z0000 Encounter for general adult medical examination without abnormal findings: Secondary | ICD-10-CM

## 2017-02-02 MED ORDER — ESCITALOPRAM 20 MG TAB
20 mg | ORAL_TABLET | Freq: Every day | ORAL | 1 refills | Status: DC
Start: 2017-02-02 — End: 2017-08-02

## 2017-02-02 MED ORDER — BUPROPION XL 150 MG 24 HR TAB
150 mg | ORAL_TABLET | ORAL | 1 refills | Status: DC
Start: 2017-02-02 — End: 2017-08-02

## 2017-02-02 MED ORDER — ALPRAZOLAM 0.5 MG TAB
0.5 mg | ORAL_TABLET | Freq: Three times a day (TID) | ORAL | 5 refills | Status: DC | PRN
Start: 2017-02-02 — End: 2017-08-02

## 2017-02-02 NOTE — Patient Instructions (Signed)
Continue taking all the medications on this list as directed; this list is current and please discontinue any medications not on this list. Please call the office or use "My Chart" online to request medication refills prior to running out.  It was a pleasure to see and care for you this visit; I look forward to seeing you next time.   Also please remember that we keep "Urgent Care" visit slots in reserve every day for any acute illness or injury. If you ever have an urgent issue please call our office rather than going to an Urgent Care and we should always be able to see you within 24 hours.

## 2017-02-02 NOTE — Assessment & Plan Note (Signed)
Between 3 and 10 minutes spent advising patient regarding the deleterious effects of tobacco use including increased risk of: stroke, heart attack, high blood pressure, cancer, etc. Patient voices understanding of risks and acknowledges it would be better if the patient avoids further tobacco exposure. Discussed with patient possible options to help with tobacco cessation including: nicotine gum/patches/vapor and medication options.

## 2017-02-02 NOTE — Progress Notes (Signed)
Kermit BaloMichael Luiscarlos Kaczmarczyk, M.D.  [facility]  [address]  [place], [place] [id number]  [phone]  Visit Date  02/02/2017  Patient Info  April CottonCindy D Gay  41 y.o.female  DOB: 07/27/1975  MRN: [id number]    Subjective    Chief Complaint   Patient presents with   ??? Nicotine Dependence     Discuss smoking cessation   ??? Anxiety     F/U, refill med, med pended   ??? Depression     F/U, refill med, med pended     41 y.o. female presents for an evaluation of chronic health conditions, lab work and/or review, medication prescriptions and/or refills, and annual physical.     Vaccinations (most recent date)   Influenza (Yearly): Will get today  Tetanus (Q10 Years): 2017  Shingles (Age 26+): NI  Pneumovax (Age 43+): NI  Prevnar (Age 43+): NI    Cancer Screening (most recent date)  Colon (Age 42-75): NI  Breast (Age 42-75 Q2 Years): 07/2016, normal results  Cervical (Age 10130-65 Pap/HPV Q5 Years): Hysterectomy 11/15/16.  See's Dr. Janice NorrieFrench - OB/GYN.  HPV - Never    Disease Screening (most recent date)  Cholesterol (Age 42-75 Q5 Years): 07/21/16  Diabetes (Age 42-70 Q3 Years): 12/05/16  Tobacco Use (Yearly): Current   Alcohol Use (Yearly): Occassionally  Illicit Drug Use (Yearly): No  STD's (Yearly): Never  Bone Density (Age 43+ if female): NI    No flowsheet data found.  Other Providers Seen  Patient Care Team:  Renaldo FiddlerPeters, Ravinder Hofland L, MD as PCP - [facility](Family Practice)  Abigail MiyamotoFrench, Glenn M, MD (Obstetrics & Gynecology)    Risk Factors  Risk factors (depression, fall risk, smoking, poor nutrition, physical inactivity, obesity, etc) were identified and appropriate counseling give and/or referrals were made as below. If no counseling given and/or referrals were made then no risk factors as listed were identified or there is already a treatment plan in place to address said risk factors.        Patient is here to discuss the following problems/concerns:  1. Annual physical exam    2. Panic anxiety syndrome    3. Depression, recurrent (HCC)     4. Tobacco use disorder    5. Abnormal uterine bleeding (AUB)    6. DUB (dysfunctional uterine bleeding)    7. Iron deficiency anemia due to chronic blood loss      HPI  Patient states that the chronic health problems are stable and well controlled on current regimens and has no acute complaints or concerns today with the exception of continued chronic fatigue, mild depression, and wanting to fully quit smoking (currently smoking 4-6 cigs per day most days).     Divorce proceedings finalized a few weeks ago in early Nov.  Patient also had hysterectomy d/t anemia from chronic blood loss; issue has resolved since the procedure.     Review of Systems   Constitutional: Positive for fatigue. Negative for fever.   HENT: Negative for congestion and sore throat.    Eyes: Negative for pain and visual disturbance.   Respiratory: Negative for cough and shortness of breath.    Cardiovascular: Negative for chest pain and palpitations.   Gastrointestinal: Negative for abdominal pain, constipation, diarrhea and nausea.   Endocrine: Negative for polydipsia and polyphagia.   Genitourinary: Negative for difficulty urinating and menstrual problem.   Musculoskeletal: Negative for arthralgias and myalgias.   Skin: Negative for color change and rash.   Allergic/Immunologic: Negative for environmental allergies  and food allergies.   Neurological: Negative for weakness and headaches.   Hematological: Does not bruise/bleed easily.   Psychiatric/Behavioral: Positive for dysphoric mood. Negative for sleep disturbance. The patient is nervous/anxious.        Objective    Vitals:    02/02/17 1012   BP: 102/66   Pulse: 81   SpO2: 96%   Weight: 133 lb 2 oz (60.4 kg)   Height: 5\' 3"  (1.6 m)     BP Readings from Last 3 Encounters:   02/02/17 102/66   12/22/16 112/60   12/05/16 122/78     Body mass index is 23.58 kg/m??.    Wt Readings from Last 3 Encounters:   02/02/17 133 lb 2 oz (60.4 kg)   12/22/16 134 lb (60.8 kg)   12/05/16 130 lb (59 kg)        Physical Exam   Constitutional: She appears well-developed and well-nourished.   HENT:   Head: Normocephalic and atraumatic.   Eyes: Conjunctivae and EOM are normal. Pupils are equal, round, and reactive to light.   Neck: Normal range of motion and phonation normal.   Cardiovascular: Normal rate, regular rhythm, normal heart sounds and intact distal pulses.   Pulmonary/Chest: Effort normal and breath sounds normal. No respiratory distress.   Abdominal: Soft. Normal appearance.   Musculoskeletal: She exhibits no edema or deformity.   Neurological: She is alert. She displays no tremor. Coordination and gait normal.   Skin: Skin is dry. No rash noted.   Psychiatric: Her behavior is normal. Her mood appears anxious. She exhibits a depressed mood.   Vitals reviewed.      Assessment/Plan  Problem List Items Addressed This Visit        HCC Disorders    Depression, recurrent (HCC)     Problem and/or symptoms are currently not stable and/or worsening on current treatment plan. Will have patient follow up as directed and make the following changes for further evaluation and/or treatment:   For this as well as tobacco cessation purposes, am starting patient on Wellbutrin extended release 150 mg daily.  Follow-up response at next scheduled office visit.         Relevant Medications    escitalopram oxalate (LEXAPRO) 20 mg tablet    ALPRAZolam (XANAX) 0.5 mg tablet    buPROPion XL (WELLBUTRIN XL) 150 mg tablet       Other    RESOLVED: Abnormal uterine bleeding (AUB)      issue has resolved so we will removed from active problem list.             Annual physical exam - Primary      Discussed ideal body weight and encouraged regular physical activity and healthy diet. Recommended routine preventative measures such as always wearing seatbelt, home safety measures such as improved traction for bathroom floor, and firearm safety. Counseled the patient regarding the  rationale for the recommended immunizations and screenings and they are current and/or updated.             RESOLVED: DUB (dysfunctional uterine bleeding)      issue resolved so I am removing from chronic problem list             RESOLVED: Iron deficiency anemia due to chronic blood loss      Problem and/or Symptoms are currently stable and/or improving on current treatment plan.  Will continue and have patient follow up as directed.  Issue has resolved with normal hemoglobin.  We  will removed from active problem list.             Panic anxiety syndrome      Problem and/or Symptoms are currently stable and/or improving on current treatment plan.  Will continue and have patient follow up as directed.  Continue with Lexapro 20 mg daily and Xanax 3 times daily as needed             Relevant Medications    escitalopram oxalate (LEXAPRO) 20 mg tablet    ALPRAZolam (XANAX) 0.5 mg tablet    buPROPion XL (WELLBUTRIN XL) 150 mg tablet    Tobacco use disorder      Between 3 and 10 minutes spent advising patient regarding the deleterious effects of tobacco use including increased risk of: stroke, heart attack, high blood pressure, cancer, etc. Patient voices understanding of risks and acknowledges it would be better if the patient avoids further tobacco exposure. Discussed with patient possible options to help with tobacco cessation including: nicotine gum/patches/vapor and medication options.               Relevant Medications    escitalopram oxalate (LEXAPRO) 20 mg tablet    ALPRAZolam (XANAX) 0.5 mg tablet    buPROPion XL (WELLBUTRIN XL) 150 mg tablet          Orders Placed This Encounter   ??? escitalopram oxalate (LEXAPRO) 20 mg tablet     Sig: Take 1 Tab by mouth daily.     Dispense:  90 Tab     Refill:  1   ??? ALPRAZolam (XANAX) 0.5 mg tablet     Sig: Take 1 Tab by mouth three (3) times daily as needed for Anxiety. Max Daily Amount: 1.5 mg.     Dispense:  90 Tab     Refill:  5    ??? buPROPion XL (WELLBUTRIN XL) 150 mg tablet     Sig: Take 1 Tab by mouth every morning.     Dispense:  90 Tab     Refill:  1       The following portions of the patient's history were reviewed and updated as appropriate: problem list, current medications, past medical history, past surgical history, past social history, family history, and allergies.    Patient Active Problem List   Diagnosis Code   ??? Depression, recurrent (HCC) F33.9   ??? Panic anxiety syndrome F41.0   ??? Tobacco use disorder F17.200   ??? Annual physical exam Z00.00   ??? Weight gain, abnormal R63.5   ??? S/P laparoscopic hysterectomy Z90.710     Current Outpatient Medications   Medication Sig   ??? escitalopram oxalate (LEXAPRO) 20 mg tablet Take 1 Tab by mouth daily.   ??? ALPRAZolam (XANAX) 0.5 mg tablet Take 1 Tab by mouth three (3) times daily as needed for Anxiety. Max Daily Amount: 1.5 mg.   ??? buPROPion XL (WELLBUTRIN XL) 150 mg tablet Take 1 Tab by mouth every morning.   ??? ibuprofen (MOTRIN) 800 mg tablet Take 1 Tab by mouth every eight (8) hours as needed.     No current facility-administered medications for this visit.      Past Medical History:   Diagnosis Date   ??? Anemia     daily iron supplement, seen in ER 09/2016  received 3 units PRBC and Megace    ??? Anxiety    ??? Current smoker    ??? Depression, recurrent (HCC)    ??? Menorrhagia with irregular cycle    ???  Weight gain, abnormal 07/21/2016     Past Surgical History:   Procedure Laterality Date   ??? HX BILATERAL SALPINGECTOMY     ??? HX CESAREAN SECTION      x2   ??? HX CYST REMOVAL     ??? HX GYN      fibroid removed    ??? HX TOTAL LAPAROSCOPIC HYSTERECTOMY  10/2016   ??? HX WISDOM TEETH EXTRACTION        Social History     Socioeconomic History   ??? Marital status: LEGALLY SEPARATED     Spouse name: Not on file   ??? Number of children: Not on file   ??? Years of education: Not on file   ??? Highest education level: Not on file   Social Needs   ??? Financial resource strain: Not on file    ??? Food insecurity - worry: Not on file   ??? Food insecurity - inability: Not on file   ??? Transportation needs - medical: Not on file   ??? Transportation needs - non-medical: Not on file   Occupational History   ??? Not on file   Tobacco Use   ??? Smoking status: Current Every Day Smoker     Packs/day: 0.50     Years: 4.00     Pack years: 2.00     Start date: 05/19/2015   ??? Smokeless tobacco: Never Used   Substance and Sexual Activity   ??? Alcohol use: Yes     Comment: socially    ??? Drug use: No   ??? Sexual activity: Yes     Partners: Male     Birth control/protection: None     Comment: hysterectomy   Other Topics Concern   ??? Not on file   Social History Narrative   ??? Not on file     Family History   Problem Relation Age of Onset   ??? Depression Mother    ??? No Known Problems Son    ??? No Known Problems Daughter    ??? Breast Cancer Neg Hx      No Known Allergies    Lab Only on 01/26/2017   Component Date Value Ref Range Status   ??? TSH 01/26/2017 1.830  0.450 - 4.500 uIU/mL Final   ??? Glucose POC 01/26/2017 82  60 - 99 mg/dL Final    Fasting   ??? WBC 01/26/2017 5.9  3.4 - 10.8 x10E3/uL Final   ??? RBC 01/26/2017 4.57  3.77 - 5.28 x10E6/uL Final   ??? HGB 01/26/2017 13.6  11.1 - 15.9 g/dL Final   ??? HCT 16/10/960411/10/2016 40.5  34.0 - 46.6 % Final   ??? MCV 01/26/2017 89  79 - 97 fL Final   ??? MCH 01/26/2017 29.8  26.6 - 33.0 pg Final   ??? MCHC 01/26/2017 33.6  31.5 - 35.7 g/dL Final   ??? RDW 54/09/811911/10/2016 15.4  12.3 - 15.4 % Final   ??? PLATELET 01/26/2017 227  150 - 379 x10E3/uL Final   ??? NEUTROPHILS 01/26/2017 60  Not Estab. % Final   ??? Lymphocytes 01/26/2017 32  Not Estab. % Final   ??? MONOCYTES 01/26/2017 7  Not Estab. % Final   ??? EOSINOPHILS 01/26/2017 1  Not Estab. % Final   ??? BASOPHILS 01/26/2017 0  Not Estab. % Final   ??? ABS. NEUTROPHILS 01/26/2017 3.4  1.4 - 7.0 x10E3/uL Final   ??? Abs Lymphocytes 01/26/2017 1.9  0.7 - 3.1 x10E3/uL Final   ??? ABS.  MONOCYTES 01/26/2017 0.4  0.1 - 0.9 x10E3/uL Final    ??? ABS. EOSINOPHILS 01/26/2017 0.1  0.0 - 0.4 x10E3/uL Final   ??? ABS. BASOPHILS 01/26/2017 0.0  0.0 - 0.2 x10E3/uL Final   ??? IMMATURE GRANULOCYTES 01/26/2017 0  Not Estab. % Final   ??? ABS. IMM. GRANS. 01/26/2017 0.0  0.0 - 0.1 x10E3/uL Final   ??? Cholesterol, total 01/26/2017 204* 100 - 199 mg/dL Final   ??? Triglyceride 01/26/2017 68  0 - 149 mg/dL Final   ??? HDL Cholesterol 01/26/2017 89  >39 mg/dL Final   ??? VLDL, calculated 01/26/2017 14  5 - 40 mg/dL Final   ??? LDL, calculated 01/26/2017 101* 0 - 99 mg/dL Final   Office Visit on 12/05/2016   Component Date Value Ref Range Status   ??? Hemoglobin (POC) 12/05/2016 12.9   Final   Admission on 11/15/2016, Discharged on 11/16/2016   Component Date Value Ref Range Status   ??? Crossmatch Expiration 11/15/2016 11/18/2016   Final   ??? ABO/Rh(D) 11/15/2016 O POSITIVE   Final   ??? Antibody screen 11/15/2016 NEG   Final   ??? Pregnancy test,urine (POC) 11/15/2016 NEGATIVE   NEG   Final   ??? Pregnancy test,urine (POC) 11/15/2016 NEGATIVE   NEG   Final   ??? Sodium 11/15/2016 142  136 - 145 mmol/L Final   ??? Potassium 11/15/2016 3.2* 3.5 - 5.1 mmol/L Final   ??? Chloride 11/15/2016 106  98 - 107 mmol/L Final   ??? CO2 11/15/2016 26  21 - 32 mmol/L Final   ??? Anion gap 11/15/2016 10  7 - 16 mmol/L Final   ??? Glucose 11/15/2016 120* 65 - 100 mg/dL Final    Comment: 47 - 60 mg/dl Consistent with, but not fully diagnostic of hypoglycemia.  101 - 125 mg/dl Impaired fasting glucose/consistent with pre-diabetes mellitus  > 126 mg/dl Fasting glucose consistent with overt diabetes mellitus     ??? BUN 11/15/2016 8  6 - 23 MG/DL Final   ??? Creatinine 11/15/2016 0.79  0.6 - 1.0 MG/DL Final   ??? GFR est AA 11/15/2016 >60  >60 ml/min/1.80m2 Final   ??? GFR est non-AA 11/15/2016 >60  >60 ml/min/1.45m2 Final    Comment: (NOTE)  Estimated GFR is calculated using the Modification of Diet in Renal   Disease (MDRD) Study equation, reported for both African Americans    (GFRAA) and non-African Americans (GFRNA), and normalized to 1.79m2   body surface area. The physician must decide which value applies to   the patient. The MDRD study equation should only be used in   individuals age 63 or older. It has not been validated for the   following: pregnant women, patients with serious comorbid conditions,   or on certain medications, or persons with extremes of body size,   muscle mass, or nutritional status.     ??? Calcium 11/15/2016 7.5* 8.3 - 10.4 MG/DL Final   ??? HGB 16/12/9602 8.9* 11.7 - 15.4 g/dL Final   ??? HCT 54/11/8117 28.5* 35.8 - 46.3 % Final   Hospital Outpatient Visit on 11/11/2016   Component Date Value Ref Range Status   ??? HGB 11/11/2016 12.2  11.7 - 15.4 g/dL Final

## 2017-02-02 NOTE — Assessment & Plan Note (Signed)
Problem and/or symptoms are currently not stable and/or worsening on current treatment plan. Will have patient follow up as directed and make the following changes for further evaluation and/or treatment:   For this as well as tobacco cessation purposes, am starting patient on Wellbutrin extended release 150 mg daily.  Follow-up response at next scheduled office visit.

## 2017-02-02 NOTE — Addendum Note (Signed)
Addended by: Renaldo FiddlerPETERS, Egan Sahlin L on: 02/02/2017 12:09 PM     Modules accepted: Orders

## 2017-02-02 NOTE — Addendum Note (Signed)
Addended by: Kem KaysJENSEN, Maleek Craver K on: 02/02/2017 10:47 AM     Modules accepted: Orders

## 2017-02-02 NOTE — Assessment & Plan Note (Signed)
issue has resolved so we will removed from active problem list.

## 2017-02-02 NOTE — Assessment & Plan Note (Signed)
Problem and/or Symptoms are currently stable and/or improving on current treatment plan.  Will continue and have patient follow up as directed.  Issue has resolved with normal hemoglobin.  We will removed from active problem list.

## 2017-02-02 NOTE — Assessment & Plan Note (Signed)
issue resolved so I am removing from chronic problem list

## 2017-02-02 NOTE — Assessment & Plan Note (Signed)
Discussed ideal body weight and encouraged regular physical activity and healthy diet. Recommended routine preventative measures such as always wearing seatbelt, home safety measures such as improved traction for bathroom floor, and firearm safety. Counseled the patient regarding the rationale for the recommended immunizations and screenings and they are current and/or updated.

## 2017-02-02 NOTE — Assessment & Plan Note (Signed)
Problem and/or Symptoms are currently stable and/or improving on current treatment plan.  Will continue and have patient follow up as directed.  Continue with Lexapro 20 mg daily and Xanax 3 times daily as needed

## 2017-08-02 ENCOUNTER — Ambulatory Visit
Admit: 2017-08-02 | Discharge: 2017-08-02 | Payer: BLUE CROSS/BLUE SHIELD | Attending: Family Medicine | Primary: Family Medicine

## 2017-08-02 DIAGNOSIS — F339 Major depressive disorder, recurrent, unspecified: Secondary | ICD-10-CM

## 2017-08-02 MED ORDER — ALPRAZOLAM 0.5 MG TAB
0.5 mg | ORAL_TABLET | Freq: Every day | ORAL | 1 refills | Status: DC | PRN
Start: 2017-08-02 — End: 2017-12-04

## 2017-08-02 MED ORDER — ESCITALOPRAM 20 MG TAB
20 mg | ORAL_TABLET | Freq: Every day | ORAL | 1 refills | Status: DC
Start: 2017-08-02 — End: 2018-02-02

## 2017-08-02 MED ORDER — IBUPROFEN 800 MG TAB
800 mg | ORAL_TABLET | Freq: Three times a day (TID) | ORAL | 5 refills | Status: AC | PRN
Start: 2017-08-02 — End: ?

## 2017-08-02 MED ORDER — BUPROPION XL 150 MG 24 HR TAB
150 mg | ORAL_TABLET | ORAL | 1 refills | Status: DC
Start: 2017-08-02 — End: 2018-02-02

## 2017-08-02 NOTE — Assessment & Plan Note (Signed)
Problem and/or Symptoms are currently stable and/or improving on current treatment plan.  Will continue and have patient follow up as directed.

## 2017-08-02 NOTE — Telephone Encounter (Signed)
See note below.     Per last ofv 02/02/17:    "Xanax 3 times daily as needed".    Please advise.

## 2017-08-02 NOTE — Telephone Encounter (Signed)
Current script is correct; pt has only been taking Xanax QHS, not TID.

## 2017-08-02 NOTE — Progress Notes (Signed)
Kermit Balo, M.D.  [facility]  [address] [place], [place] [address]  [phone]  Visit Date  08/02/2017  Patient Info  April Gay  42 y.o.female  DOB: 1976/03/11  MRN: [id number]    Subjective    Chief Complaint   Patient presents with   ??? Follow-up     6 month follow up   ??? Medication Refill     Patient is here to discuss the following problems/concerns:  1. Depression, recurrent (HCC)    2. Panic anxiety syndrome    3. Tobacco dependence due to cigarettes    4. Chronic midline thoracic back pain      HPI  Patient states that the chronic health problems are stable and well controlled on current regimens and has no acute complaints or concerns today except as mentioned above.    Review of Systems   Constitutional: Negative for fatigue and fever.   HENT: Negative for congestion and sore throat.    Eyes: Negative for pain and visual disturbance.   Respiratory: Negative for cough and shortness of breath.    Cardiovascular: Negative for chest pain and palpitations.   Gastrointestinal: Negative for abdominal pain, constipation, diarrhea and nausea.   Endocrine: Negative for polydipsia and polyphagia.   Genitourinary: Negative for difficulty urinating and menstrual problem.   Musculoskeletal: Positive for back pain. Negative for arthralgias and myalgias.   Skin: Negative for color change and rash.   Allergic/Immunologic: Negative for environmental allergies and food allergies.   Neurological: Negative for weakness and headaches.   Hematological: Does not bruise/bleed easily.   Psychiatric/Behavioral: Negative for dysphoric mood and sleep disturbance.       Objective    Vitals:    08/02/17 0904   BP: 104/60   Pulse: 79   Resp: 16   Temp: 98.2 ??F (36.8 ??C)   TempSrc: Oral   SpO2: 99%   Weight: 133 lb 3.2 oz (60.4 kg)   Height:  (1.6 m)     BP Readings from Last 3 Encounters:   08/02/17 104/60   02/02/17 102/66   12/22/16 112/60     Body mass index is 23.6 kg/m??.     Wt Readings from Last 3 Encounters:   08/02/17 133 lb 3.2 oz (60.4 kg)   02/02/17 133 lb 2 oz (60.4 kg)   12/22/16 134 lb (60.8 kg)       Physical Exam   Constitutional: She appears well-developed and well-nourished.   HENT:   Head: Normocephalic and atraumatic.   Eyes: Conjunctivae and EOM are normal.   Neck: Normal range of motion and phonation normal.   Cardiovascular: Normal rate.   Pulmonary/Chest: Effort normal. No respiratory distress.   Abdominal: Soft. Normal appearance.   Musculoskeletal: She exhibits no edema or deformity.   Neurological: She is alert. She displays no tremor. Coordination and gait normal.   Skin: Skin is dry. No rash noted.   Psychiatric: She has a normal mood and affect. Her behavior is normal.   Vitals reviewed.      Assessment/Plan  Problem List Items Addressed This Visit        HCC Disorders    Depression, recurrent (HCC) - Primary     Problem and/or Symptoms are currently stable and/or improving on current treatment plan.  Will continue and have patient follow up as directed.             Relevant Medications    escitalopram oxalate (LEXAPRO) 20 mg  tablet    buPROPion XL (WELLBUTRIN XL) 150 mg tablet    ALPRAZolam (XANAX) 0.5 mg tablet       Other    Panic anxiety syndrome      Problem and/or Symptoms are currently stable and/or improving on current treatment plan.  Will continue and have patient follow up as directed.                 Relevant Medications    escitalopram oxalate (LEXAPRO) 20 mg tablet    buPROPion XL (WELLBUTRIN XL) 150 mg tablet    ALPRAZolam (XANAX) 0.5 mg tablet    Tobacco dependence due to cigarettes      Problem and/or Symptoms are currently stable and/or improving on current treatment plan.  Will continue and have patient follow up as directed.                 Relevant Medications    escitalopram oxalate (LEXAPRO) 20 mg tablet    buPROPion XL (WELLBUTRIN XL) 150 mg tablet    ALPRAZolam (XANAX) 0.5 mg tablet      Other Visit Diagnoses      Chronic midline thoracic back pain        Stable: Patient uses ibuprofen as needed    Relevant Medications    ibuprofen (MOTRIN) 800 mg tablet          Orders Placed This Encounter   ??? escitalopram oxalate (LEXAPRO) 20 mg tablet     Sig: Take 1 Tab by mouth daily.     Dispense:  90 Tab     Refill:  1   ??? buPROPion XL (WELLBUTRIN XL) 150 mg tablet     Sig: Take 1 Tab by mouth every morning.     Dispense:  90 Tab     Refill:  1   ??? ALPRAZolam (XANAX) 0.5 mg tablet     Sig: Take 1 Tab by mouth daily as needed for Anxiety.     Dispense:  90 Tab     Refill:  1   ??? ibuprofen (MOTRIN) 800 mg tablet     Sig: Take 1 Tab by mouth every eight (8) hours as needed for Pain.     Dispense:  90 Tab     Refill:  5       The following portions of the patient's history were reviewed and updated as appropriate: problem list, current medications, past medical history, past surgical history, past social history, family history, and allergies.    Patient Active Problem List   Diagnosis Code   ??? Depression, recurrent (HCC) F33.9   ??? Panic anxiety syndrome F41.0   ??? Annual physical exam Z00.00   ??? Weight gain, abnormal R63.5   ??? S/P laparoscopic hysterectomy Z90.710   ??? Tobacco dependence due to cigarettes F17.210     Current Outpatient Medications   Medication Sig   ??? escitalopram oxalate (LEXAPRO) 20 mg tablet Take 1 Tab by mouth daily.   ??? buPROPion XL (WELLBUTRIN XL) 150 mg tablet Take 1 Tab by mouth every morning.   ??? ALPRAZolam (XANAX) 0.5 mg tablet Take 1 Tab by mouth daily as needed for Anxiety.   ??? ibuprofen (MOTRIN) 800 mg tablet Take 1 Tab by mouth every eight (8) hours as needed for Pain.     No current facility-administered medications for this visit.      Past Medical History:   Diagnosis Date   ??? Anemia     daily iron supplement, seen  in ER 09/2016  received 3 units PRBC and Megace    ??? Anxiety    ??? Current smoker    ??? Depression, recurrent (HCC)    ??? Menorrhagia with irregular cycle    ??? Weight gain, abnormal 07/21/2016      Past Surgical History:   Procedure Laterality Date   ??? HX BILATERAL SALPINGECTOMY     ??? HX CESAREAN SECTION      x2   ??? HX CYST REMOVAL     ??? HX GYN      fibroid removed    ??? HX TOTAL LAPAROSCOPIC HYSTERECTOMY  10/2016   ??? HX WISDOM TEETH EXTRACTION       Social History     Socioeconomic History   ??? Marital status: LEGALLY SEPARATED     Spouse name: Not on file   ??? Number of children: Not on file   ??? Years of education: Not on file   ??? Highest education level: Not on file   Occupational History   ??? Not on file   Social Needs   ??? Financial resource strain: Not on file   ??? Food insecurity:     Worry: Not on file     Inability: Not on file   ??? Transportation needs:     Medical: Not on file     Non-medical: Not on file   Tobacco Use   ??? Smoking status: Current Every Day Smoker     Packs/day: 0.50     Years: 4.00     Pack years: 2.00     Start date: 05/19/2015   ??? Smokeless tobacco: Never Used   Substance and Sexual Activity   ??? Alcohol use: Yes     Comment: socially    ??? Drug use: No   ??? Sexual activity: Yes     Partners: Male     Birth control/protection: None     Comment: hysterectomy   Lifestyle   ??? Physical activity:     Days per week: Not on file     Minutes per session: Not on file   ??? Stress: Not on file   Relationships   ??? Social connections:     Talks on phone: Not on file     Gets together: Not on file     Attends religious service: Not on file     Active member of club or organization: Not on file     Attends meetings of clubs or organizations: Not on file     Relationship status: Not on file   ??? Intimate partner violence:     Fear of current or ex partner: Not on file     Emotionally abused: Not on file     Physically abused: Not on file     Forced sexual activity: Not on file   Other Topics Concern   ??? Not on file   Social History Narrative   ??? Not on file     Family History   Problem Relation Age of Onset   ??? Depression Mother    ??? No Known Problems Son    ??? No Known Problems Daughter     ??? Breast Cancer Neg Hx      No Known Allergies

## 2017-08-02 NOTE — Patient Instructions (Signed)
Continue taking all the medications on this list as directed; this list is current and please discontinue any medications not on this list. Please call the office or use "My Chart" online to request medication refills prior to running out.    It was a pleasure to see and care for you this visit; I look forward to seeing you next time.     Also please remember that we keep "Urgent Care" visit slots in reserve every day for any acute illness or injury. If you ever have an urgent issue please call our office rather than going to an Urgent Care and we should always be able to see you within 24 hours at most, but most often you will be able to be seen the same day that you call.

## 2017-08-02 NOTE — Progress Notes (Signed)
Alprazolam phoned to the Pharmacy.

## 2017-08-02 NOTE — Telephone Encounter (Signed)
Pharmacist advised of below note and expressed understanding.

## 2017-08-02 NOTE — Telephone Encounter (Signed)
Pharmacist L/M on voice mail saying that the pt has been taking Alprazolam TID not every day, she is requesting clarification.

## 2017-12-04 ENCOUNTER — Ambulatory Visit
Admit: 2017-12-04 | Discharge: 2017-12-04 | Payer: BLUE CROSS/BLUE SHIELD | Attending: Family Medicine | Primary: Family Medicine

## 2017-12-04 ENCOUNTER — Ambulatory Visit: Attending: Family Medicine | Primary: Family Medicine

## 2017-12-04 DIAGNOSIS — T148XXA Other injury of unspecified body region, initial encounter: Secondary | ICD-10-CM

## 2017-12-04 MED ORDER — ALPRAZOLAM 0.5 MG TAB
0.5 mg | ORAL_TABLET | Freq: Three times a day (TID) | ORAL | 1 refills | Status: DC | PRN
Start: 2017-12-04 — End: 2018-02-02

## 2017-12-04 MED ORDER — SILVER SULFADIAZINE 1 % TOPICAL CREAM
1 % | CUTANEOUS | 2 refills | Status: DC
Start: 2017-12-04 — End: 2018-02-02

## 2017-12-04 MED ORDER — HYDROCODONE-ACETAMINOPHEN 5 MG-325 MG TAB
5-325 mg | ORAL_TABLET | Freq: Four times a day (QID) | ORAL | 0 refills | Status: AC | PRN
Start: 2017-12-04 — End: 2017-12-09

## 2017-12-04 NOTE — Telephone Encounter (Signed)
Called in Xanax 05 mg 1 po tid #90/1 refill, silvadene 1% top cream, apply to areas when change of dressing 85 g/2 refill to [facility] [phone]
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
## 2017-12-04 NOTE — Addendum Note (Signed)
Addended by: Anette GuarneriPHILLIPS, Murray Guzzetta on: 12/04/2017 10:51 AM     Modules accepted: Orders

## 2017-12-04 NOTE — Patient Instructions (Signed)
Continue taking all the medications on this list as directed; this list is current and please discontinue any medications not on this list. Please call the office or use "My Chart" online to request medication refills prior to running out.    It was a pleasure to see and care for you this visit; I look forward to seeing you next time.     Also please remember that we keep "Urgent Care" visit slots in reserve every day for any acute illness or injury. If you ever have an urgent issue please call our office rather than going to an Urgent Care and we should always be able to see you within 24 hours at most, but most often you will be able to be seen the same day that you call.     Please note that I am moving to a different practice location in the near future, [facility], at [address], [place] [address]. Phone # [phone]. You are welcome to follow me to the new practice or continue at [facility] with another provider at your preference.

## 2017-12-04 NOTE — Assessment & Plan Note (Signed)
Problem and/or symptoms are currently not stable and/or worsening on current treatment plan. Will have patient follow up as directed and make the following changes for further evaluation and/or treatment:     Anxiety increased with recent trauma and pt taking Xanax 2-3 times per day; new script X 2 months given for increased frequency.

## 2017-12-04 NOTE — Assessment & Plan Note (Signed)
Problem and/or symptoms are currently not stable and/or worsening on current treatment plan. Will have patient follow up as directed and make the following changes for further evaluation and/or treatment:     Anxiety increased with recent trauma and pt taking Xanax 2-3 times per day; new script X 2 months given for increased frequency.

## 2017-12-04 NOTE — Addendum Note (Signed)
Addendum Note by Anette GuarneriPhillips, Robin, CMA at 12/04/17 0940                Author: Anette GuarneriPhillips, Robin, CMA  Service: --  Author Type: Medical Assistant       Filed: 12/04/17 1051  Encounter Date: 12/04/2017  Status: Signed          Editor: Anette GuarneriPhillips, Robin, CMA (Medical Assistant)          Addended by: Anette GuarneriPHILLIPS, ROBIN on: 12/04/2017 10:51 AM    Modules accepted: Orders
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
## 2017-12-05 NOTE — Telephone Encounter (Signed)
That's fine; tks

## 2017-12-05 NOTE — Telephone Encounter (Signed)
Pt would like to extend her sick note from work until tomorrow. She wsnt feeling well enough to go to work today.

## 2017-12-05 NOTE — Telephone Encounter (Signed)
Done. Patient notified. Faxed letter to pt work  [employer] [phone]

## 2018-01-26 ENCOUNTER — Other Ambulatory Visit: Admit: 2018-01-26 | Discharge: 2018-01-26 | Payer: BLUE CROSS/BLUE SHIELD | Primary: Family Medicine

## 2018-01-26 DIAGNOSIS — Z0000 Encounter for general adult medical examination without abnormal findings: Secondary | ICD-10-CM

## 2018-01-26 NOTE — Progress Notes (Signed)
Everything in the lab basically look better than it did previously she has an appointment in May we will discuss everything she just needs to continue doing what she is been doing because the labs look better than they did a year ago

## 2018-01-26 NOTE — Progress Notes (Signed)
patient voices understanding and acceptance of this advice and will call back if any further questions or concerns.

## 2018-01-26 NOTE — Progress Notes (Signed)
Everything in the lab basically look better than it did previously she has an appointment in May we will discuss everything she just needs to continue doing what she is been doing because the labs look better than they did a year ago

## 2018-01-27 LAB — CBC WITH AUTOMATED DIFF
ABS. BASOPHILS: 0 10*3/uL (ref 0.0–0.2)
ABS. EOSINOPHILS: 0.1 10*3/uL (ref 0.0–0.4)
ABS. IMM. GRANS.: 0 10*3/uL (ref 0.0–0.1)
ABS. MONOCYTES: 0.4 10*3/uL (ref 0.1–0.9)
ABS. NEUTROPHILS: 4 10*3/uL (ref 1.4–7.0)
Abs Lymphocytes: 1.6 10*3/uL (ref 0.7–3.1)
BASOPHILS: 1 %
EOSINOPHILS: 1 %
HCT: 42.6 % (ref 34.0–46.6)
HGB: 14.5 g/dL (ref 11.1–15.9)
IMMATURE GRANULOCYTES: 0 %
Lymphocytes: 26 %
MCH: 32.7 pg (ref 26.6–33.0)
MCHC: 34 g/dL (ref 31.5–35.7)
MCV: 96 fL (ref 79–97)
MONOCYTES: 7 %
NEUTROPHILS: 65 %
PLATELET: 254 10*3/uL (ref 150–450)
RBC: 4.43 x10E6/uL (ref 3.77–5.28)
RDW: 11.1 % — ABNORMAL LOW (ref 12.3–15.4)
WBC: 6.1 10*3/uL (ref 3.4–10.8)

## 2018-01-27 LAB — METABOLIC PANEL, COMPREHENSIVE
A-G Ratio: 1.9 (ref 1.2–2.2)
ALT (SGPT): 22 IU/L (ref 0–32)
AST (SGOT): 27 IU/L (ref 0–40)
Albumin: 4.4 g/dL (ref 3.5–5.5)
Alk. phosphatase: 45 IU/L (ref 39–117)
BUN/Creatinine ratio: 9 (ref 9–23)
BUN: 7 mg/dL (ref 6–24)
Bilirubin, total: 0.3 mg/dL (ref 0.0–1.2)
CO2: 23 mmol/L (ref 20–29)
Calcium: 9.2 mg/dL (ref 8.7–10.2)
Chloride: 103 mmol/L (ref 96–106)
Creatinine: 0.76 mg/dL (ref 0.57–1.00)
GFR est AA: 111 mL/min/{1.73_m2} (ref >59)
GFR est non-AA: 96 mL/min/{1.73_m2} (ref >59)
GLOBULIN, TOTAL: 2.3 g/dL (ref 1.5–4.5)
Glucose: 76 mg/dL (ref 65–99)
Potassium: 4.3 mmol/L (ref 3.5–5.2)
Protein, total: 6.7 g/dL (ref 6.0–8.5)
Sodium: 141 mmol/L (ref 134–144)

## 2018-01-27 LAB — LIPID PANEL
Cholesterol, Total: 182 mg/dL (ref 100–199)
Cholesterol, total: 182 mg/dL (ref 100–199)
HDL Cholesterol: 80 mg/dL (ref >39)
HDL: 80 mg/dL (ref >39)
LDL Calculated: 68 mg/dL (ref 0–99)
LDL, calculated: 68 mg/dL (ref 0–99)
Triglyceride: 171 mg/dL — ABNORMAL HIGH (ref 0–149)
Triglycerides: 171 mg/dL — ABNORMAL HIGH (ref 0–149)
VLDL Cholesterol Calculated: 34 mg/dL (ref 5–40)
VLDL, calculated: 34 mg/dL (ref 5–40)

## 2018-01-27 LAB — TSH 3RD GENERATION
TSH: 1.91 u[IU]/mL (ref 0.450–4.500)
TSH: 1.91 u[IU]/mL (ref 0.450–4.500)

## 2018-01-27 LAB — COMPREHENSIVE METABOLIC PANEL
ALT: 22 IU/L (ref 0–32)
AST: 27 IU/L (ref 0–40)
Albumin/Globulin Ratio: 1.9 NA (ref 1.2–2.2)
Albumin: 4.4 g/dL (ref 3.5–5.5)
Alkaline Phosphatase: 45 IU/L (ref 39–117)
BUN: 7 mg/dL (ref 6–24)
Bun/Cre Ratio: 9 NA (ref 9–23)
CO2: 23 mmol/L (ref 20–29)
Calcium: 9.2 mg/dL (ref 8.7–10.2)
Chloride: 103 mmol/L (ref 96–106)
Creatinine: 0.76 mg/dL (ref 0.57–1.00)
EGFR IF NonAfrican American: 96 mL/min/{1.73_m2} (ref >59)
GFR African American: 111 mL/min/{1.73_m2} (ref >59)
Globulin, Total: 2.3 g/dL (ref 1.5–4.5)
Glucose: 76 mg/dL (ref 65–99)
Potassium: 4.3 mmol/L (ref 3.5–5.2)
Sodium: 141 mmol/L (ref 134–144)
Total Bilirubin: 0.3 mg/dL (ref 0.0–1.2)
Total Protein: 6.7 g/dL (ref 6.0–8.5)

## 2018-01-27 LAB — CBC WITH AUTO DIFFERENTIAL
Basophils %: 1 %
Basophils Absolute: 0 10*3/uL (ref 0.0–0.2)
Eosinophils %: 1 %
Eosinophils Absolute: 0.1 10*3/uL (ref 0.0–0.4)
Granulocyte Absolute Count: 0 10*3/uL (ref 0.0–0.1)
Hematocrit: 42.6 % (ref 34.0–46.6)
Hemoglobin: 14.5 g/dL (ref 11.1–15.9)
Immature Granulocytes: 0 %
Lymphocytes %: 26 %
Lymphocytes Absolute: 1.6 10*3/uL (ref 0.7–3.1)
MCH: 32.7 pg (ref 26.6–33.0)
MCHC: 34 g/dL (ref 31.5–35.7)
MCV: 96 fL (ref 79–97)
Monocytes %: 7 %
Monocytes Absolute: 0.4 10*3/uL (ref 0.1–0.9)
Neutrophils %: 65 %
Neutrophils Absolute: 4 10*3/uL (ref 1.4–7.0)
Platelets: 254 10*3/uL (ref 150–450)
RBC: 4.43 x10E6/uL (ref 3.77–5.28)
RDW: 11.1 % — ABNORMAL LOW (ref 12.3–15.4)
WBC: 6.1 10*3/uL (ref 3.4–10.8)

## 2018-02-02 ENCOUNTER — Encounter: Attending: Family Medicine | Primary: Family Medicine

## 2018-02-02 ENCOUNTER — Ambulatory Visit
Admit: 2018-02-02 | Discharge: 2018-02-02 | Payer: BLUE CROSS/BLUE SHIELD | Attending: Family Medicine | Primary: Family Medicine

## 2018-02-02 ENCOUNTER — Ambulatory Visit: Attending: Family Medicine | Primary: Family Medicine

## 2018-02-02 DIAGNOSIS — F339 Major depressive disorder, recurrent, unspecified: Secondary | ICD-10-CM

## 2018-02-02 MED ORDER — ESCITALOPRAM 20 MG TAB
20 mg | ORAL_TABLET | Freq: Every day | ORAL | 1 refills | Status: DC
Start: 2018-02-02 — End: 2018-06-21

## 2018-02-02 MED ORDER — BUPROPION XL 150 MG 24 HR TAB
150 mg | ORAL_TABLET | ORAL | 1 refills | Status: AC
Start: 2018-02-02 — End: ?

## 2018-02-02 MED ORDER — ALPRAZOLAM 0.5 MG TAB
0.5 mg | ORAL_TABLET | Freq: Three times a day (TID) | ORAL | 1 refills | Status: DC | PRN
Start: 2018-02-02 — End: 2018-03-30

## 2018-02-02 NOTE — Progress Notes (Signed)
FAMILY MEDICINE  [facility]  Particia Jasper, M.D.  [address]  [place], [place] [address]  Office : [phone]  Fax : [phone]  02/02/2018    April Gay is a 42 y.o. female.   Chief Complaint   Patient presents with   ??? Anxiety   ??? Medication Refill       HPI patient comes in today she was a patient of Dr. Noe Gens is decided to stay with our practice the first time that I have seen her.  She is here to get refills of her medication she takes Wellbutrin, Lexapro, and Xanax this is for her anxiety and also depression.  She states that the combination of medications has worked well and she has no complaints at present time.    Review of Systems   Constitutional: Positive for malaise/fatigue. Negative for chills and fever.   Eyes: Negative for blurred vision and photophobia.   Respiratory: Negative for cough and shortness of breath.    Cardiovascular: Negative for chest pain and palpitations.   Gastrointestinal: Negative for heartburn and nausea.   Musculoskeletal: Negative for joint pain and myalgias.   Neurological: Negative for dizziness, tingling, tremors and headaches.   Psychiatric/Behavioral: Positive for depression. The patient is nervous/anxious.      Past medical history, past surgical history, family history, social history, and allergies and medications were reviewed with patient and/or family member and updated as appropriate  No Known Allergies    Past Medical History:   Diagnosis Date   ??? Anemia     daily iron supplement, seen in ER 09/2016  received 3 units PRBC and Megace    ??? Anxiety    ??? Current smoker    ??? Depression, recurrent (HCC)    ??? Menorrhagia with irregular cycle    ??? Weight gain, abnormal 07/21/2016       Past Surgical History:   Procedure Laterality Date   ??? HX BILATERAL SALPINGECTOMY     ??? HX CESAREAN SECTION      x2   ??? HX CYST REMOVAL     ??? HX GYN      fibroid removed    ??? HX TOTAL LAPAROSCOPIC HYSTERECTOMY  10/2016   ??? HX WISDOM TEETH EXTRACTION          Family History   Problem Relation Age of Onset   ??? Depression Mother    ??? No Known Problems Son    ??? No Known Problems Daughter    ??? Breast Cancer Neg Hx        Social History     Socioeconomic History   ??? Marital status: LEGALLY SEPARATED     Spouse name: Not on file   ??? Number of children: Not on file   ??? Years of education: Not on file   ??? Highest education level: Not on file   Occupational History   ??? Not on file   Social Needs   ??? Financial resource strain: Not on file   ??? Food insecurity:     Worry: Not on file     Inability: Not on file   ??? Transportation needs:     Medical: Not on file     Non-medical: Not on file   Tobacco Use   ??? Smoking status: Current Every Day Smoker     Packs/day: 0.50     Years: 4.00     Pack years: 2.00     Start date: 05/19/2015   ???  Smokeless tobacco: Never Used   Substance and Sexual Activity   ??? Alcohol use: Yes     Comment: socially    ??? Drug use: No   ??? Sexual activity: Yes     Partners: Male     Birth control/protection: None     Comment: hysterectomy   Lifestyle   ??? Physical activity:     Days per week: Not on file     Minutes per session: Not on file   ??? Stress: Not on file   Relationships   ??? Social connections:     Talks on phone: Not on file     Gets together: Not on file     Attends religious service: Not on file     Active member of club or organization: Not on file     Attends meetings of clubs or organizations: Not on file     Relationship status: Not on file   ??? Intimate partner violence:     Fear of current or ex partner: Not on file     Emotionally abused: Not on file     Physically abused: Not on file     Forced sexual activity: Not on file   Other Topics Concern   ??? Not on file   Social History Narrative   ??? Not on file       Visit Vitals  BP 110/70 (BP 1 Location: Left arm, BP Patient Position: Sitting)   Pulse 68   Temp 98.6 ??F (37 ??C) (Oral)   Ht 5\' 3"  (1.6 m)   Wt 135 lb (61.2 kg)   LMP 11/09/2016 (Exact Date)   SpO2 99%   BMI 23.91 kg/m??          Physical Exam   Constitutional: She is oriented to person, place, and time. She appears well-developed and well-nourished. No distress.   HENT:   Head: Normocephalic and atraumatic.   Eyes: Pupils are equal, round, and reactive to light. Conjunctivae and EOM are normal.   Neck: Normal range of motion. Neck supple. No thyromegaly present.   Cardiovascular: Normal rate and regular rhythm.   Pulmonary/Chest: Effort normal and breath sounds normal.   Musculoskeletal: Normal range of motion.   Neurological: She is alert and oriented to person, place, and time.   Skin: Skin is warm and dry.   Psychiatric: She has a normal mood and affect. Thought content normal.   Vitals reviewed.      ASSESSMENT and PLAN  Diagnoses and all orders for this visit:    1. Depression, recurrent (HCC)  -     buPROPion XL (WELLBUTRIN XL) 150 mg tablet; Take 1 Tab by mouth every morning.  -     Problem and/or Symptoms are new to provider. Will have patient follow up as directed and make the following plan for further evaluation and/or treatlment:      2. Tobacco dependence due to cigarettes  -     buPROPion XL (WELLBUTRIN XL) 150 mg tablet; Take 1 Tab by mouth every morning.    3. Panic anxiety syndrome  -     escitalopram oxalate (LEXAPRO) 20 mg tablet; Take 1 Tab by mouth daily.  -     ALPRAZolam (XANAX) 0.5 mg tablet; Take 1 Tab by mouth three (3) times daily as needed for Anxiety. Max Daily Amount: 1.5 mg.  -     Problem and/or Symptoms are new to provider. Will have patient follow up as directed and make the  following plan for further evaluation and/or treatlment:

## 2018-02-02 NOTE — Progress Notes (Signed)
FAMILY MEDICINE  [facility]  Particia Gay, M.D.  [address]  [place], [place] [address]  Office : [phone]  Fax : [phone]  02/02/2018    April Gay is a 42 y.o. female.   Chief Complaint   Patient presents with   ??? Anxiety   ??? Medication Refill       HPI patient comes in today she was a patient of Dr. Noe Gens is decided to stay with our practice the first time that I have seen her.  She is here to get refills of her medication she takes Wellbutrin, Lexapro, and Xanax this is for her anxiety and also depression.  She states that the combination of medications has worked well and she has no complaints at present time.    Review of Systems   Constitutional: Positive for malaise/fatigue. Negative for chills and fever.   Eyes: Negative for blurred vision and photophobia.   Respiratory: Negative for cough and shortness of breath.    Cardiovascular: Negative for chest pain and palpitations.   Gastrointestinal: Negative for heartburn and nausea.   Musculoskeletal: Negative for joint pain and myalgias.   Neurological: Negative for dizziness, tingling, tremors and headaches.   Psychiatric/Behavioral: Positive for depression. The patient is nervous/anxious.      Past medical history, past surgical history, family history, social history, and allergies and medications were reviewed with patient and/or family member and updated as appropriate  No Known Allergies    Past Medical History:   Diagnosis Date   ??? Anemia     daily iron supplement, seen in ER 09/2016  received 3 units PRBC and Megace    ??? Anxiety    ??? Current smoker    ??? Depression, recurrent (HCC)    ??? Menorrhagia with irregular cycle    ??? Weight gain, abnormal 07/21/2016       Past Surgical History:   Procedure Laterality Date   ??? HX BILATERAL SALPINGECTOMY     ??? HX CESAREAN SECTION      x2   ??? HX CYST REMOVAL     ??? HX GYN      fibroid removed    ??? HX TOTAL LAPAROSCOPIC HYSTERECTOMY  10/2016   ??? HX WISDOM TEETH EXTRACTION         Family History    Problem Relation Age of Onset   ??? Depression Mother    ??? No Known Problems Son    ??? No Known Problems Daughter    ??? Breast Cancer Neg Hx        Social History     Socioeconomic History   ??? Marital status: LEGALLY SEPARATED     Spouse name: Not on file   ??? Number of children: Not on file   ??? Years of education: Not on file   ??? Highest education level: Not on file   Occupational History   ??? Not on file   Social Needs   ??? Financial resource strain: Not on file   ??? Food insecurity:     Worry: Not on file     Inability: Not on file   ??? Transportation needs:     Medical: Not on file     Non-medical: Not on file   Tobacco Use   ??? Smoking status: Current Every Day Smoker     Packs/day: 0.50     Years: 4.00     Pack years: 2.00     Start date: 05/19/2015   ???  Smokeless tobacco: Never Used   Substance and Sexual Activity   ??? Alcohol use: Yes     Comment: socially    ??? Drug use: No   ??? Sexual activity: Yes     Partners: Male     Birth control/protection: None     Comment: hysterectomy   Lifestyle   ??? Physical activity:     Days per week: Not on file     Minutes per session: Not on file   ??? Stress: Not on file   Relationships   ??? Social connections:     Talks on phone: Not on file     Gets together: Not on file     Attends religious service: Not on file     Active member of club or organization: Not on file     Attends meetings of clubs or organizations: Not on file     Relationship status: Not on file   ??? Intimate partner violence:     Fear of current or ex partner: Not on file     Emotionally abused: Not on file     Physically abused: Not on file     Forced sexual activity: Not on file   Other Topics Concern   ??? Not on file   Social History Narrative   ??? Not on file       Visit Vitals  BP 110/70 (BP 1 Location: Left arm, BP Patient Position: Sitting)   Pulse 68   Temp 98.6 ??F (37 ??C) (Oral)   Ht 5\' 3"  (1.6 m)   Wt 135 lb (61.2 kg)   LMP 11/09/2016 (Exact Date)   SpO2 99%   BMI 23.91 kg/m??         Physical Exam    Constitutional: She is oriented to person, place, and time. She appears well-developed and well-nourished. No distress.   HENT:   Head: Normocephalic and atraumatic.   Eyes: Pupils are equal, round, and reactive to light. Conjunctivae and EOM are normal.   Neck: Normal range of motion. Neck supple. No thyromegaly present.   Cardiovascular: Normal rate and regular rhythm.   Pulmonary/Chest: Effort normal and breath sounds normal.   Musculoskeletal: Normal range of motion.   Neurological: She is alert and oriented to person, place, and time.   Skin: Skin is warm and dry.   Psychiatric: She has a normal mood and affect. Thought content normal.   Vitals reviewed.      ASSESSMENT and PLAN  Diagnoses and all orders for this visit:    1. Depression, recurrent (HCC)  -     buPROPion XL (WELLBUTRIN XL) 150 mg tablet; Take 1 Tab by mouth every morning.  -     Problem and/or Symptoms are new to provider. Will have patient follow up as directed and make the following plan for further evaluation and/or treatlment:      2. Tobacco dependence due to cigarettes  -     buPROPion XL (WELLBUTRIN XL) 150 mg tablet; Take 1 Tab by mouth every morning.    3. Panic anxiety syndrome  -     escitalopram oxalate (LEXAPRO) 20 mg tablet; Take 1 Tab by mouth daily.  -     ALPRAZolam (XANAX) 0.5 mg tablet; Take 1 Tab by mouth three (3) times daily as needed for Anxiety. Max Daily Amount: 1.5 mg.  -     Problem and/or Symptoms are new to provider. Will have patient follow up as directed and make the  following plan for further evaluation and/or treatlment:

## 2018-03-30 ENCOUNTER — Encounter

## 2018-03-30 MED ORDER — ALPRAZOLAM 0.5 MG TAB
0.5 mg | ORAL_TABLET | Freq: Three times a day (TID) | ORAL | 1 refills | Status: DC | PRN
Start: 2018-03-30 — End: 2018-06-05

## 2018-03-30 NOTE — Telephone Encounter (Signed)
E scribed to pharmacy, she is current on her visits

## 2018-03-30 NOTE — Telephone Encounter (Signed)
Last appt was 02-02-2018

## 2018-06-05 ENCOUNTER — Encounter

## 2018-06-05 MED ORDER — ALPRAZOLAM 0.5 MG TAB
0.5 mg | ORAL_TABLET | Freq: Three times a day (TID) | ORAL | 1 refills | Status: DC | PRN
Start: 2018-06-05 — End: 2018-06-21

## 2018-06-05 NOTE — Telephone Encounter (Signed)
E scribed medication to pharmacy, current on visits

## 2018-06-21 ENCOUNTER — Encounter: Payer: BLUE CROSS/BLUE SHIELD | Attending: Family Medicine | Primary: Family Medicine

## 2018-06-21 ENCOUNTER — Telehealth
Admit: 2018-06-21 | Discharge: 2018-06-21 | Payer: BLUE CROSS/BLUE SHIELD | Attending: Family Medicine | Primary: Family Medicine

## 2018-06-21 ENCOUNTER — Telehealth: Attending: Family Medicine | Primary: Family Medicine

## 2018-06-21 DIAGNOSIS — F410 Panic disorder [episodic paroxysmal anxiety] without agoraphobia: Secondary | ICD-10-CM

## 2018-06-21 MED ORDER — ESCITALOPRAM 20 MG TAB
20 mg | ORAL_TABLET | Freq: Every day | ORAL | 1 refills | Status: AC
Start: 2018-06-21 — End: ?

## 2018-06-21 MED ORDER — ALPRAZOLAM 0.5 MG TAB
0.5 mg | ORAL_TABLET | Freq: Three times a day (TID) | ORAL | 1 refills | Status: AC | PRN
Start: 2018-06-21 — End: ?

## 2018-06-21 NOTE — Progress Notes (Signed)
FAMILY MEDICINE  [facility]  Particia Jasper, M.D.  [address]  [place], [place] [address]  Office : [phone]  Fax : [phone]  06/21/2018    April Gay is a 43 y.o. female.  Chief Complaint   Patient presents with   ??? Anxiety       HPI patient is doing virtual visit because of her anxiety and she needs to refill her Xanax and Lexapro.  She has having to stop the Wellbutrin because it cost too much and wants to discuss how to wean off of it.  She states that she was laid off last week and so she is more anxious than she has been in the past.    Review of Systems   Constitutional: Positive for malaise/fatigue. Negative for chills and fever.   Eyes: Negative for blurred vision and photophobia.   Respiratory: Negative for cough and shortness of breath.    Cardiovascular: Negative for chest pain and palpitations.   Gastrointestinal: Negative for heartburn and nausea.   Musculoskeletal: Negative for joint pain and myalgias.   Neurological: Negative for dizziness, tingling, tremors and headaches.   Psychiatric/Behavioral: Negative for depression. The patient is nervous/anxious.      Past medical history, past surgical history, family history, social history, and allergies and medications were reviewed with patient and/or family member and updated as appropriate  No Known Allergies    Past Medical History:   Diagnosis Date   ??? Anemia     daily iron supplement, seen in ER 09/2016  received 3 units PRBC and Megace    ??? Anxiety    ??? Current smoker    ??? Depression, recurrent (HCC)    ??? Menorrhagia with irregular cycle    ??? Weight gain, abnormal 07/21/2016       Past Surgical History:   Procedure Laterality Date   ??? HX BILATERAL SALPINGECTOMY     ??? HX CESAREAN SECTION      x2   ??? HX CYST REMOVAL     ??? HX GYN      fibroid removed    ??? HX TOTAL LAPAROSCOPIC HYSTERECTOMY  10/2016   ??? HX WISDOM TEETH EXTRACTION         Family History   Problem Relation Age of Onset   ??? Depression Mother     ??? No Known Problems Son    ??? No Known Problems Daughter    ??? Breast Cancer Neg Hx        Social History     Socioeconomic History   ??? Marital status: LEGALLY SEPARATED     Spouse name: Not on file   ??? Number of children: Not on file   ??? Years of education: Not on file   ??? Highest education level: Not on file   Occupational History   ??? Not on file   Social Needs   ??? Financial resource strain: Not on file   ??? Food insecurity     Worry: Not on file     Inability: Not on file   ??? Transportation needs     Medical: Not on file     Non-medical: Not on file   Tobacco Use   ??? Smoking status: Current Every Day Smoker     Packs/day: 0.50     Years: 4.00     Pack years: 2.00     Start date: 05/19/2015   ??? Smokeless tobacco: Never Used   Substance and Sexual Activity   ???  Alcohol use: Yes     Comment: socially    ??? Drug use: No   ??? Sexual activity: Yes     Partners: Male     Birth control/protection: None     Comment: hysterectomy   Lifestyle   ??? Physical activity     Days per week: Not on file     Minutes per session: Not on file   ??? Stress: Not on file   Relationships   ??? Social Wellsite geologist on phone: Not on file     Gets together: Not on file     Attends religious service: Not on file     Active member of club or organization: Not on file     Attends meetings of clubs or organizations: Not on file     Relationship status: Not on file   ??? Intimate partner violence     Fear of current or ex partner: Not on file     Emotionally abused: Not on file     Physically abused: Not on file     Forced sexual activity: Not on file   Other Topics Concern   ??? Not on file   Social History Narrative   ??? Not on file       Visit Vitals  LMP 11/09/2016 (Exact Date)         Physical Exam  Constitutional:       Appearance: Normal appearance.   Neurological:      Mental Status: She is alert.     No other physical was done on this virtual visit    ASSESSMENT and PLAN  Diagnoses and all orders for this visit:    1. Panic anxiety syndrome   -     ALPRAZolam (XANAX) 0.5 mg tablet; Take 1 Tab by mouth three (3) times daily as needed for Anxiety. Max Daily Amount: 1.5 mg.  -     escitalopram oxalate (LEXAPRO) 20 mg tablet; Take 1 Tab by mouth daily.  -     Problem and/or symptoms are currently not stable and/or worsening on current treatment plan. Will have patient follow up as directed and make the following changes for further evaluation and/or treatment:     -   I was in the office while conducting this encounter.    Consent:  She and/or her healthcare decision maker is aware that this patient-initiated Telehealth encounter is a billable service, with coverage as determined by her insurance carrier. She is aware that she may receive a bill and has provided verbal consent to proceed: Yes    This virtual visit was conducted via [email or web address].  Pursuant to the emergency declaration under the [facility] Act and the [employer], 1135 waiver authority and the [employer] and [employer] Act, this Virtual  Visit was conducted to reduce the patient's risk of exposure to COVID-19 and provide continuity of care for an established patient.  Services were provided through a video synchronous discussion virtually to substitute for in-person clinic visit.  Due to this being a TeleHealth evaluation, many elements of the physical examination are unable to be assessed.     Total Time: minutes: 5-10 minutes.

## 2018-06-21 NOTE — Progress Notes (Signed)
FAMILY MEDICINE  [facility]  Particia Jasper, M.D.  [address]  [place], [place] [address]  Office : [phone]  Fax : [phone]  06/21/2018    April Gay is a 43 y.o. female.  Chief Complaint   Patient presents with   ??? Anxiety       HPI patient is doing virtual visit because of her anxiety and she needs to refill her Xanax and Lexapro.  She has having to stop the Wellbutrin because it cost too much and wants to discuss how to wean off of it.  She states that she was laid off last week and so she is more anxious than she has been in the past.    Review of Systems   Constitutional: Positive for malaise/fatigue. Negative for chills and fever.   Eyes: Negative for blurred vision and photophobia.   Respiratory: Negative for cough and shortness of breath.    Cardiovascular: Negative for chest pain and palpitations.   Gastrointestinal: Negative for heartburn and nausea.   Musculoskeletal: Negative for joint pain and myalgias.   Neurological: Negative for dizziness, tingling, tremors and headaches.   Psychiatric/Behavioral: Negative for depression. The patient is nervous/anxious.      Past medical history, past surgical history, family history, social history, and allergies and medications were reviewed with patient and/or family member and updated as appropriate  No Known Allergies    Past Medical History:   Diagnosis Date   ??? Anemia     daily iron supplement, seen in ER 09/2016  received 3 units PRBC and Megace    ??? Anxiety    ??? Current smoker    ??? Depression, recurrent (HCC)    ??? Menorrhagia with irregular cycle    ??? Weight gain, abnormal 07/21/2016       Past Surgical History:   Procedure Laterality Date   ??? HX BILATERAL SALPINGECTOMY     ??? HX CESAREAN SECTION      x2   ??? HX CYST REMOVAL     ??? HX GYN      fibroid removed    ??? HX TOTAL LAPAROSCOPIC HYSTERECTOMY  10/2016   ??? HX WISDOM TEETH EXTRACTION         Family History   Problem Relation Age of Onset   ??? Depression Mother    ??? No Known Problems Son     ??? No Known Problems Daughter    ??? Breast Cancer Neg Hx        Social History     Socioeconomic History   ??? Marital status: LEGALLY SEPARATED     Spouse name: Not on file   ??? Number of children: Not on file   ??? Years of education: Not on file   ??? Highest education level: Not on file   Occupational History   ??? Not on file   Social Needs   ??? Financial resource strain: Not on file   ??? Food insecurity     Worry: Not on file     Inability: Not on file   ??? Transportation needs     Medical: Not on file     Non-medical: Not on file   Tobacco Use   ??? Smoking status: Current Every Day Smoker     Packs/day: 0.50     Years: 4.00     Pack years: 2.00     Start date: 05/19/2015   ??? Smokeless tobacco: Never Used   Substance and Sexual Activity   ???  Alcohol use: Yes     Comment: socially    ??? Drug use: No   ??? Sexual activity: Yes     Partners: Male     Birth control/protection: None     Comment: hysterectomy   Lifestyle   ??? Physical activity     Days per week: Not on file     Minutes per session: Not on file   ??? Stress: Not on file   Relationships   ??? Social Wellsite geologist on phone: Not on file     Gets together: Not on file     Attends religious service: Not on file     Active member of club or organization: Not on file     Attends meetings of clubs or organizations: Not on file     Relationship status: Not on file   ??? Intimate partner violence     Fear of current or ex partner: Not on file     Emotionally abused: Not on file     Physically abused: Not on file     Forced sexual activity: Not on file   Other Topics Concern   ??? Not on file   Social History Narrative   ??? Not on file       Visit Vitals  LMP 11/09/2016 (Exact Date)         Physical Exam  Constitutional:       Appearance: Normal appearance.   Neurological:      Mental Status: She is alert.     No other physical was done on this virtual visit    ASSESSMENT and PLAN  Diagnoses and all orders for this visit:    1. Panic anxiety syndrome  -     ALPRAZolam (XANAX) 0.5  mg tablet; Take 1 Tab by mouth three (3) times daily as needed for Anxiety. Max Daily Amount: 1.5 mg.  -     escitalopram oxalate (LEXAPRO) 20 mg tablet; Take 1 Tab by mouth daily.  -     Problem and/or symptoms are currently not stable and/or worsening on current treatment plan. Will have patient follow up as directed and make the following changes for further evaluation and/or treatment:     -   I was in the office while conducting this encounter.    Consent:  She and/or her healthcare decision maker is aware that this patient-initiated Telehealth encounter is a billable service, with coverage as determined by her insurance carrier. She is aware that she may receive a bill and has provided verbal consent to proceed: Yes    This virtual visit was conducted via [email or web address].  Pursuant to the emergency declaration under the [facility] Act and the [employer], 1135 waiver authority and the [employer] Act, this Virtual  Visit was conducted to reduce the patient's risk of exposure to COVID-19 and provide continuity of care for an established patient.  Services were provided through a video synchronous discussion virtually to substitute for in-person clinic visit.  Due to this being a TeleHealth evaluation, many elements of the physical examination are unable to be assessed.     Total Time: minutes: 5-10 minutes.

## 2018-08-02 ENCOUNTER — Encounter: Attending: Family Medicine | Primary: Family Medicine

## 2019-02-26 IMAGING — CT CT THORAX WO CONTRAST
2 of 5 series · 15 of 36 positions shown, 19 images · non-contrast
Comparison: None

HISTORY: Dyspnea with history of 6YDKO-K3 pneumonia diagnosed 5 weeks ago.
TECHNIQUE: Scans of the chest are performed in the transaxial plane at 5 mm increments with 2 mm lung reconstructions and coronal and sagittal reconstructions without contrast.

Total radiation dose to patient is CTDIvol 11.35 mGy and DLP 326.76 mGy-cm.

[Series 3: lung · axial · 0.72mm/px · z∈[-274,-36]mm · 13 of 135 slices shown, 17 images]
[im 8/135  mediastinal]
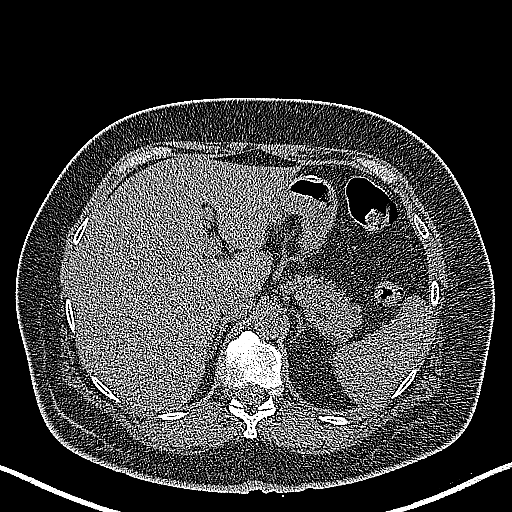
[im 8/135  lung]
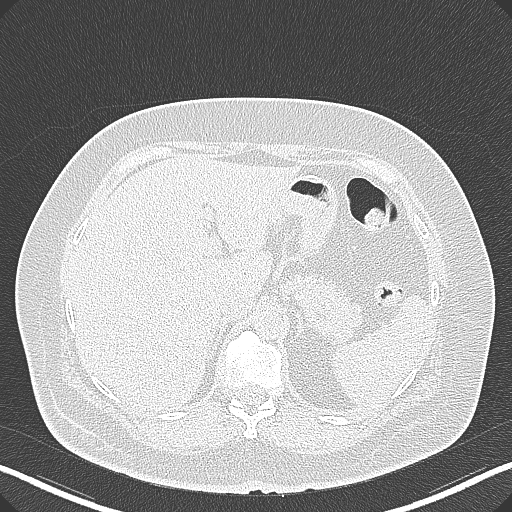
[im 22/135  lung]
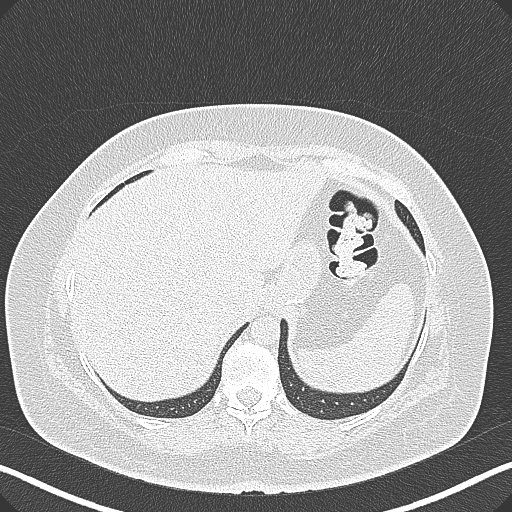
[im 29/135  lung]
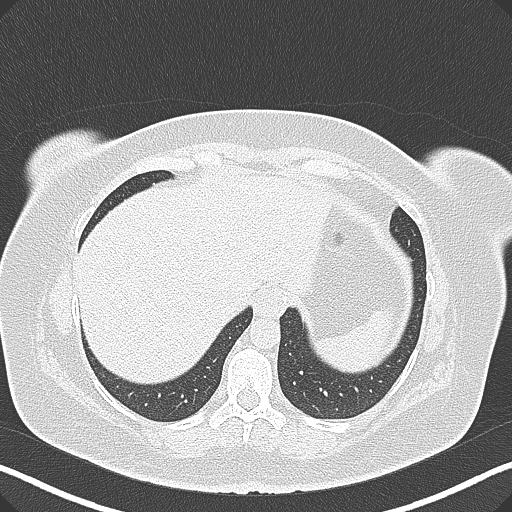
[im 36/135  lung]
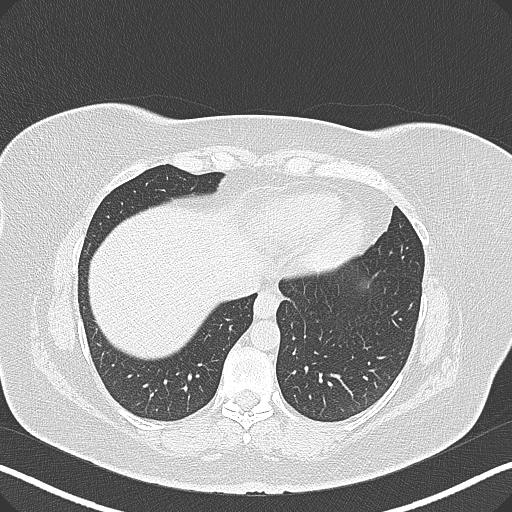
[im 50/135  mediastinal]
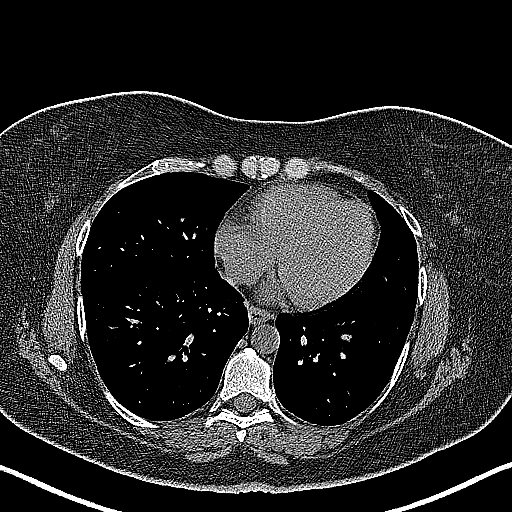
[im 50/135  lung]
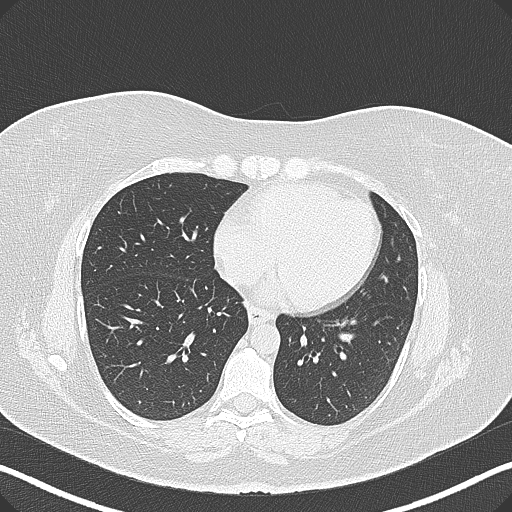
[im 57/135  lung]
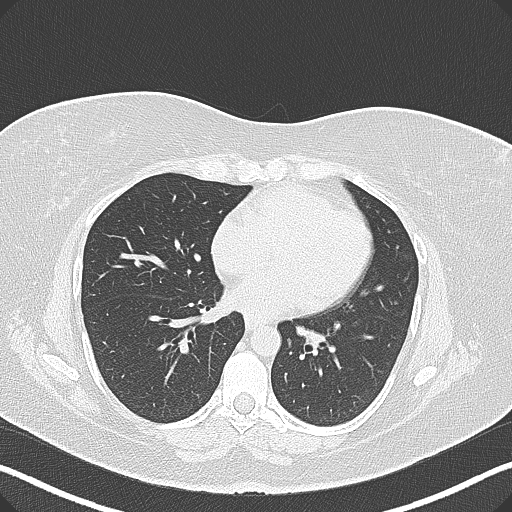
[im 71/135  lung]
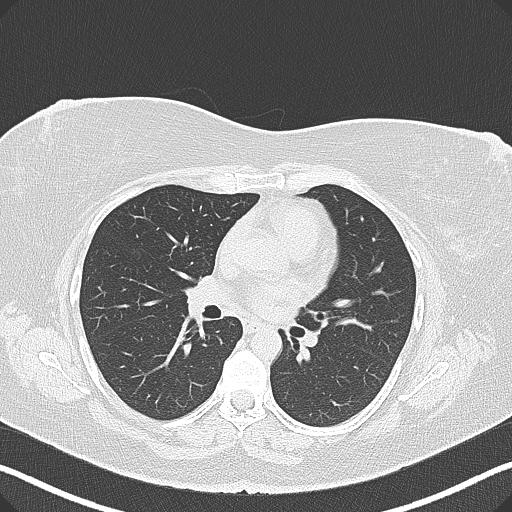
[im 78/135  lung]
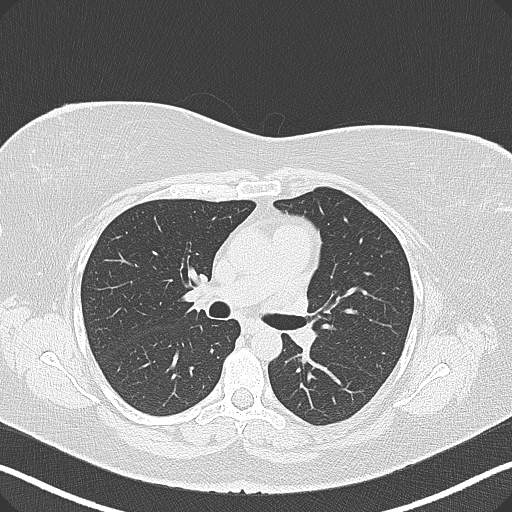
[im 85/135  mediastinal]
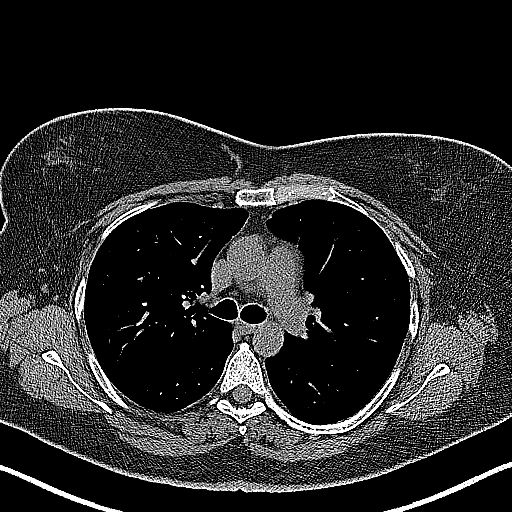
[im 85/135  lung]
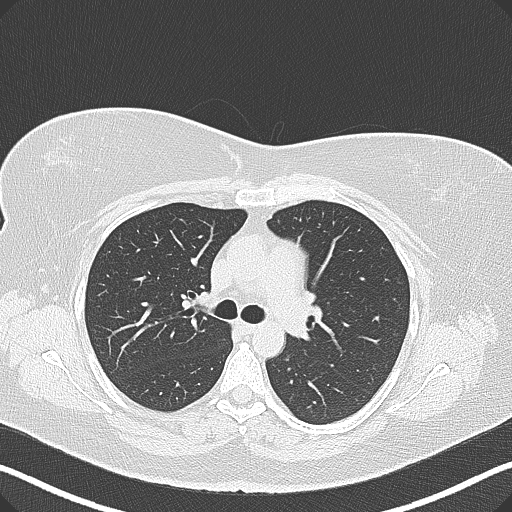
[im 99/135  lung]
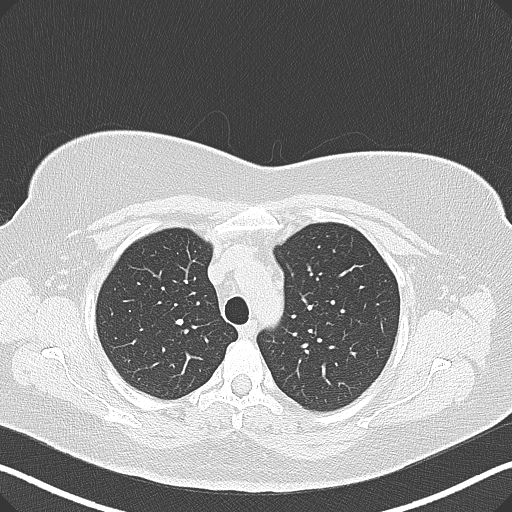
[im 106/135  lung]
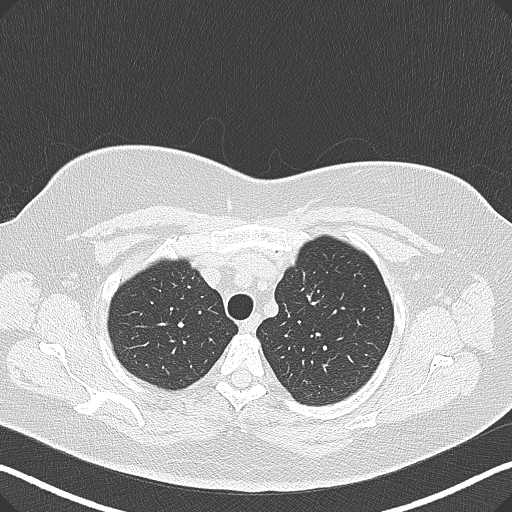
[im 113/135  lung]
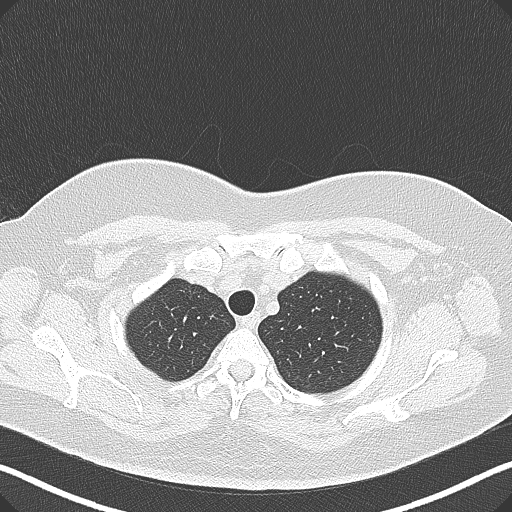
[im 127/135  mediastinal]
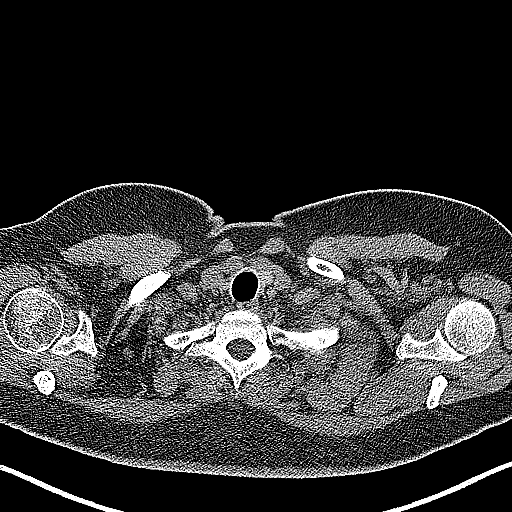
[im 127/135  lung]
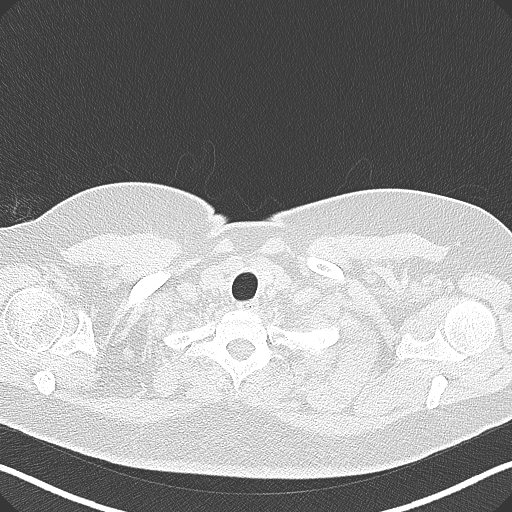

[Series 4: coronal · coronal · 0.54mm/px · 2 of 61 slices shown]
[im 21/61  lung]
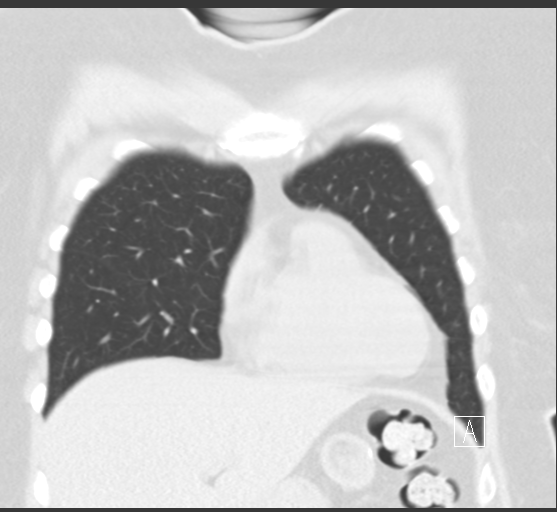
[im 41/61  lung]
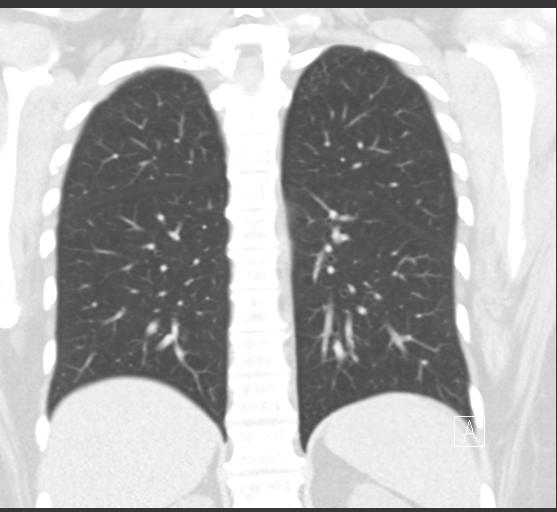

[15 of 36 positions shown; findings below may reference images not displayed]

FINDINGS: The chest wall, axillary regions and thoracic inlet are normal. Normal mediastinal vascular anatomy is seen with no adenopathy, mass or other findings. Heart size is normal. Small hiatal hernia is seen with postoperative changes of gastric sleeve procedure.

Lungs are clear, with no ground-glass pulmonary opacities, infiltrate, atelectasis or other common sequela of 6YDKO-K3 pneumonia. No pleural or tracheobronchial abnormalities are seen. Coronal and sagittal reconstructions show no additional findings. Scans which include upper abdominal structures show no additional findings.
IMPRESSION: Normal unenhanced CT chest status post gastric sleeve procedure; there are no pulmonary findings to indicate acute or residual 6YDKO-K3 pneumonia.

## 2019-03-28 IMAGING — MG MAMMO SCRN BIL W/CAD TOMO
6 of 10 series · 6 of 30 positions shown · non-contrast
Comparison: The present examination has been compared to prior imaging studies.

Images Obtained from [facility]
INDICATION: Screening.
TECHNIQUE: Bilateral 2-D digital screening mammogram was performed followed by 3-D tomosynthesis.  Current study was also evaluated with a computer aided detection (CAD) system.
MAMMOGRAM FINDINGS:
The breasts are almost entirely fatty.
No suspicious abnormality is seen in either breast.  There are no significant changes from the prior study.

[L MLO (1 of 2)]
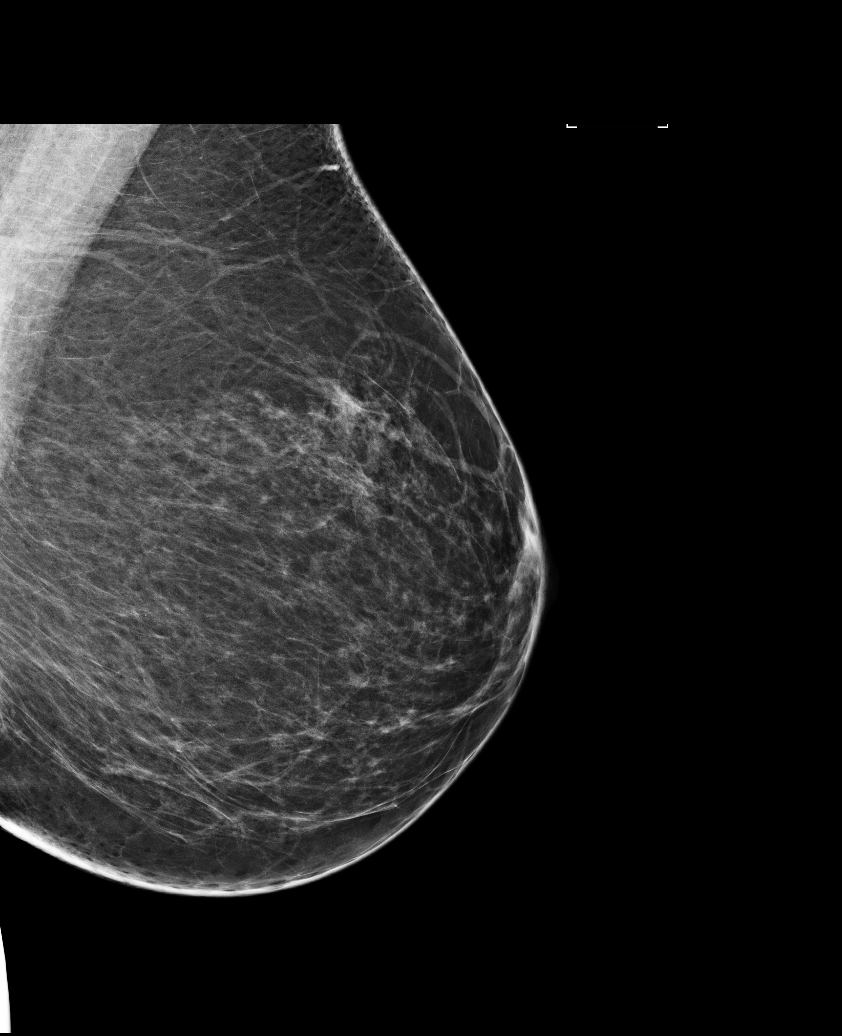

[L MLO (2 of 2)]
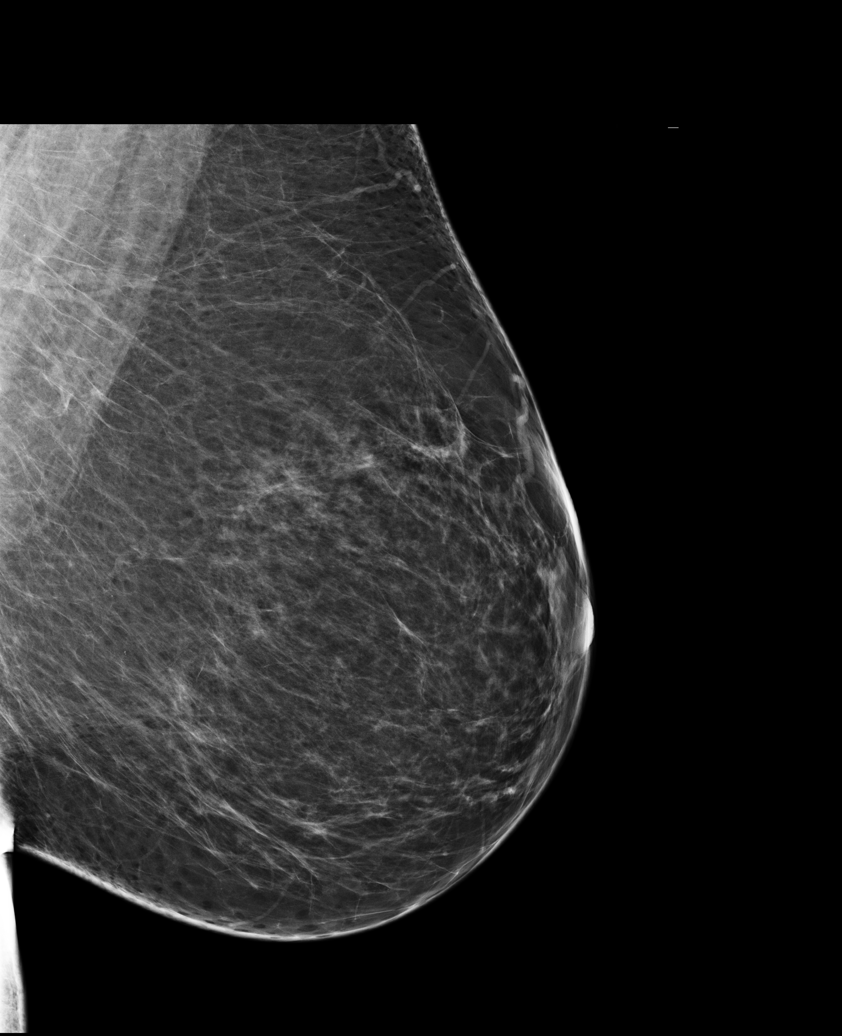

[R CC]
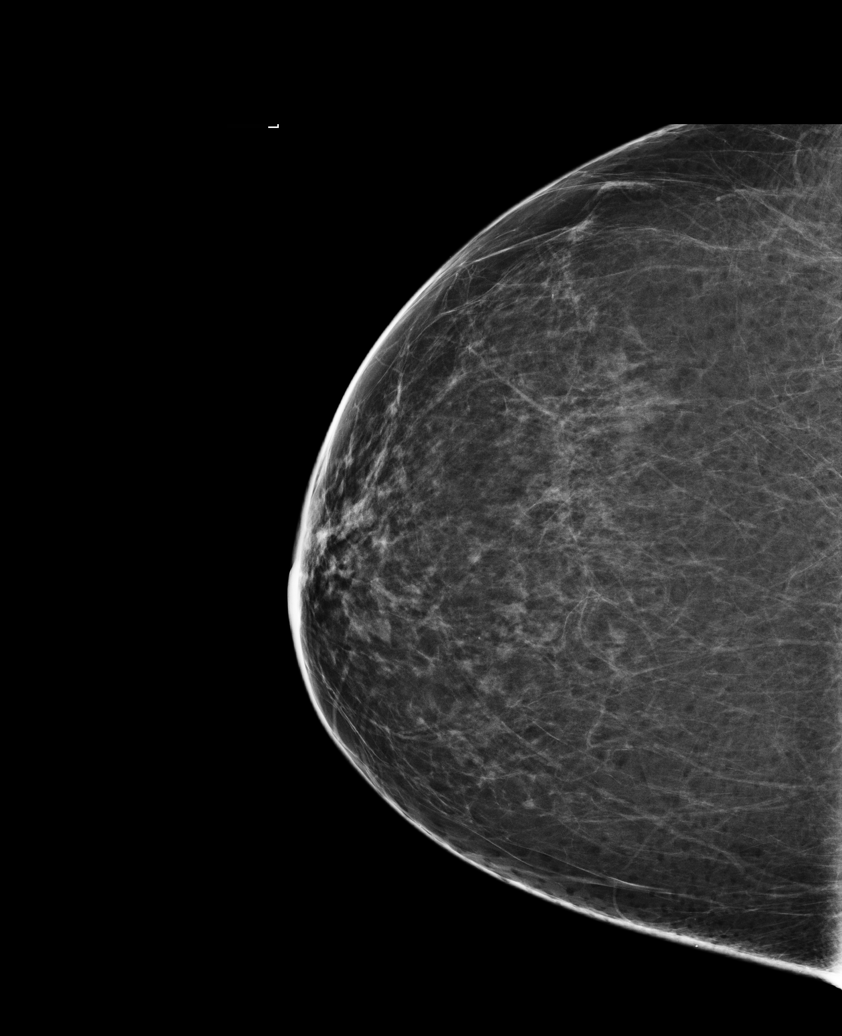

[L CC]
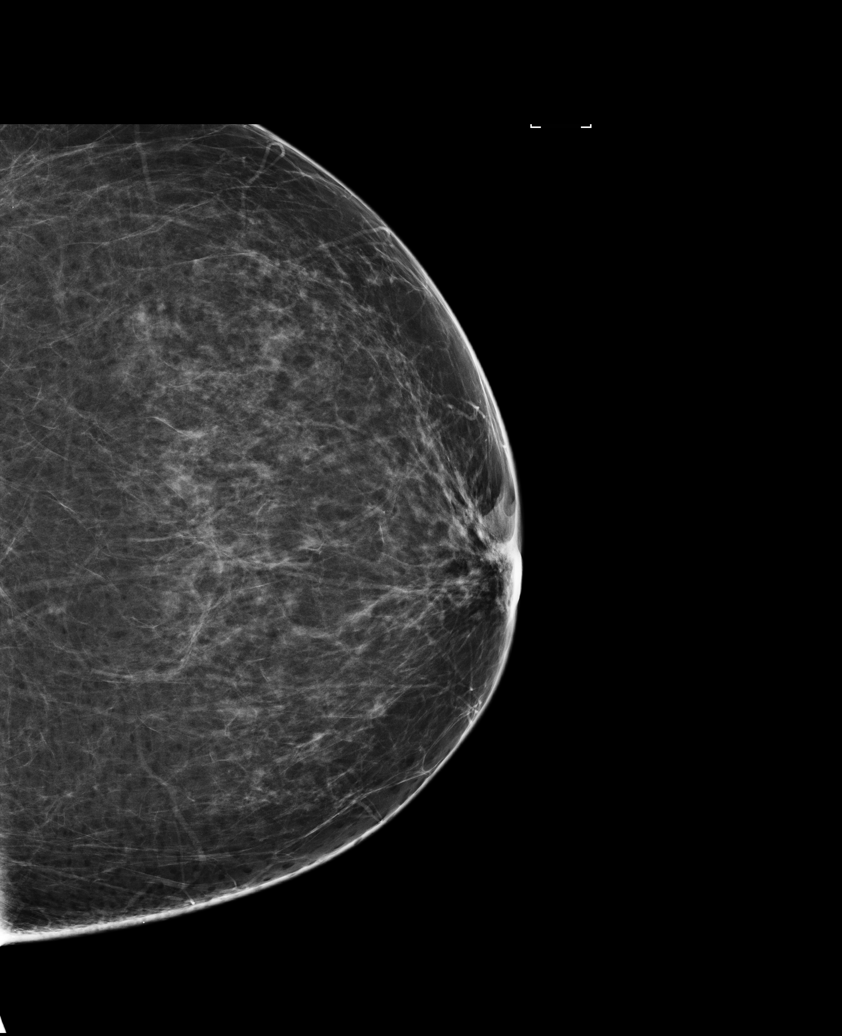

[R MLO]
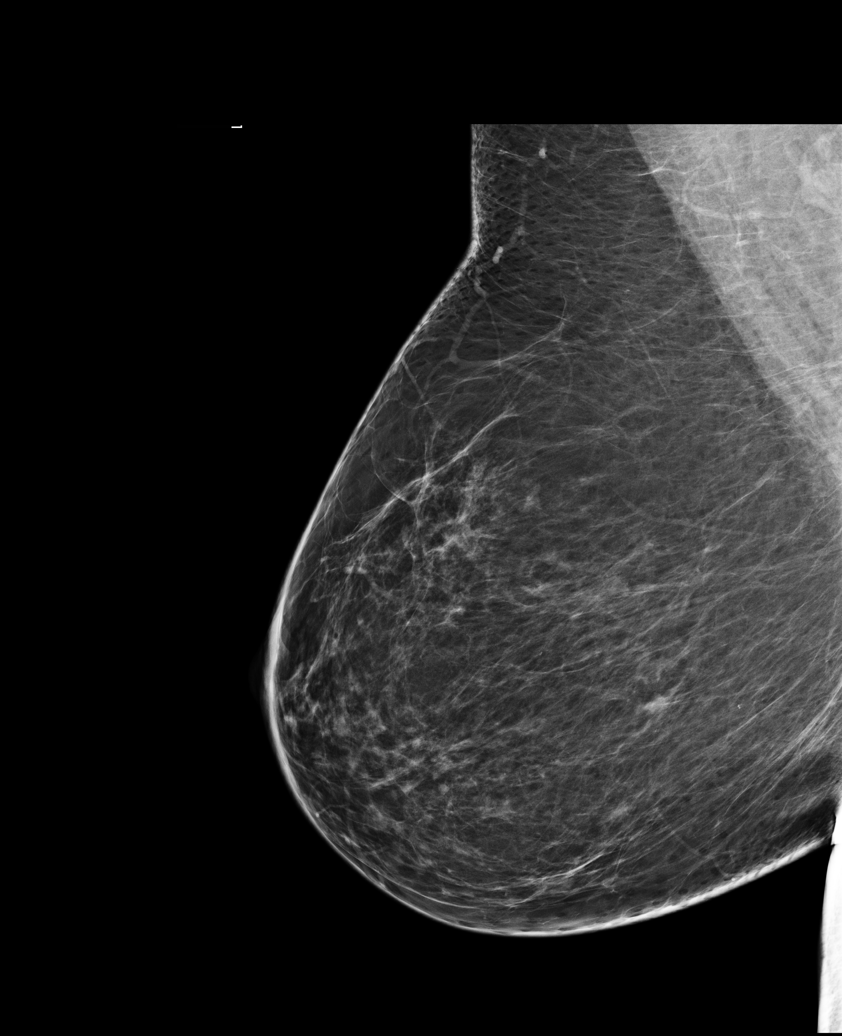

[R CC tomo · tomo slice 37/74.0]
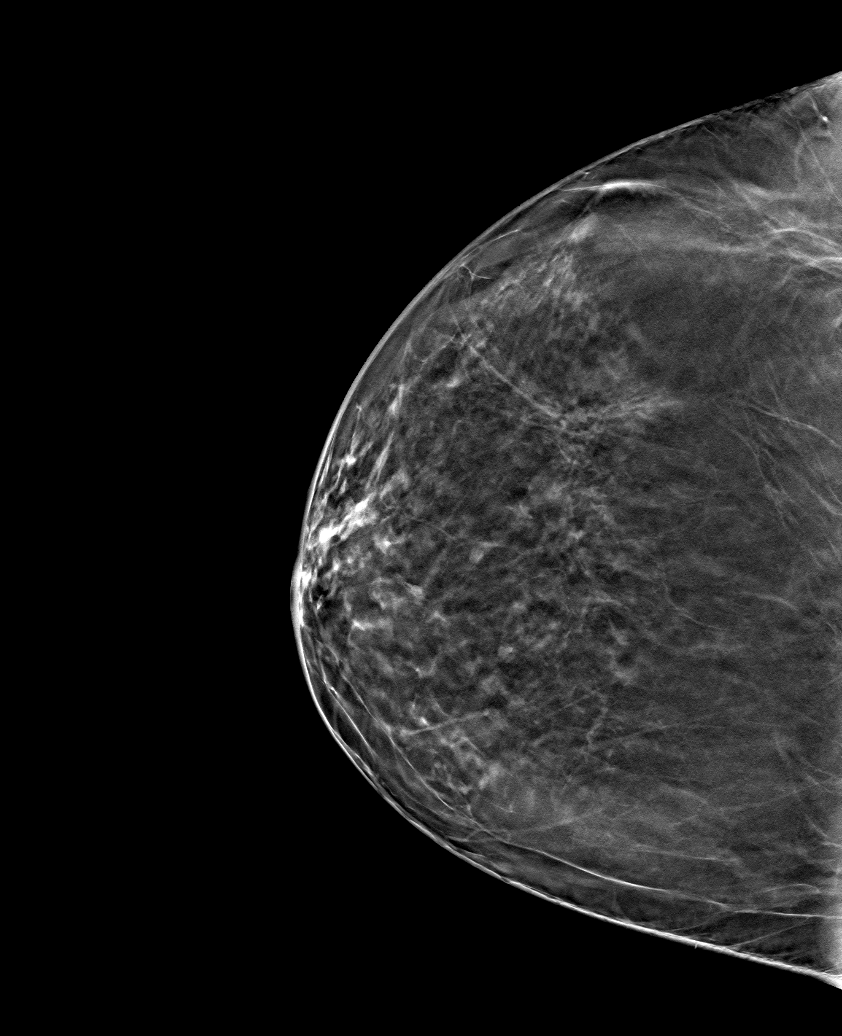

[6 of 30 positions shown; findings below may reference images not displayed]

IMPRESSION: There is no mammographic evidence of malignancy.
Screening mammogram recommended in 1 year.
BI-RADS Category 1: Negative

## 2019-05-03 IMAGING — CR FOOT LT 3 VWS MIN
1 series · 3 of 3 positions shown · non-contrast
Comparison: None

HISTORY/INDICATIONS:  Left foot pain
TECHNIQUE: Left foot 3 views. All views weightbearing

[Series 1: ap · 0.17mm/px · 3 of 3 slices shown]
[im 1/3]
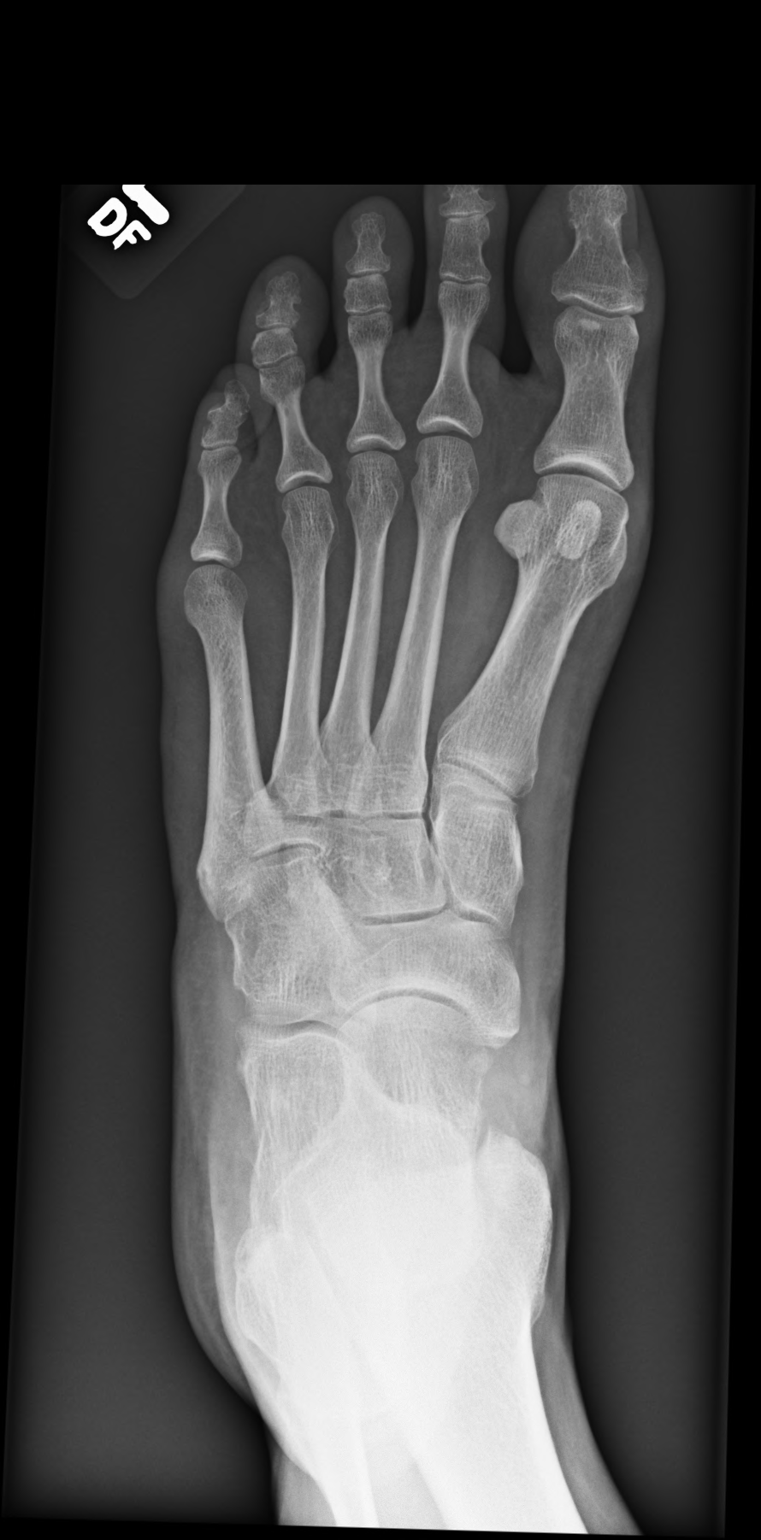
[im 2/3]
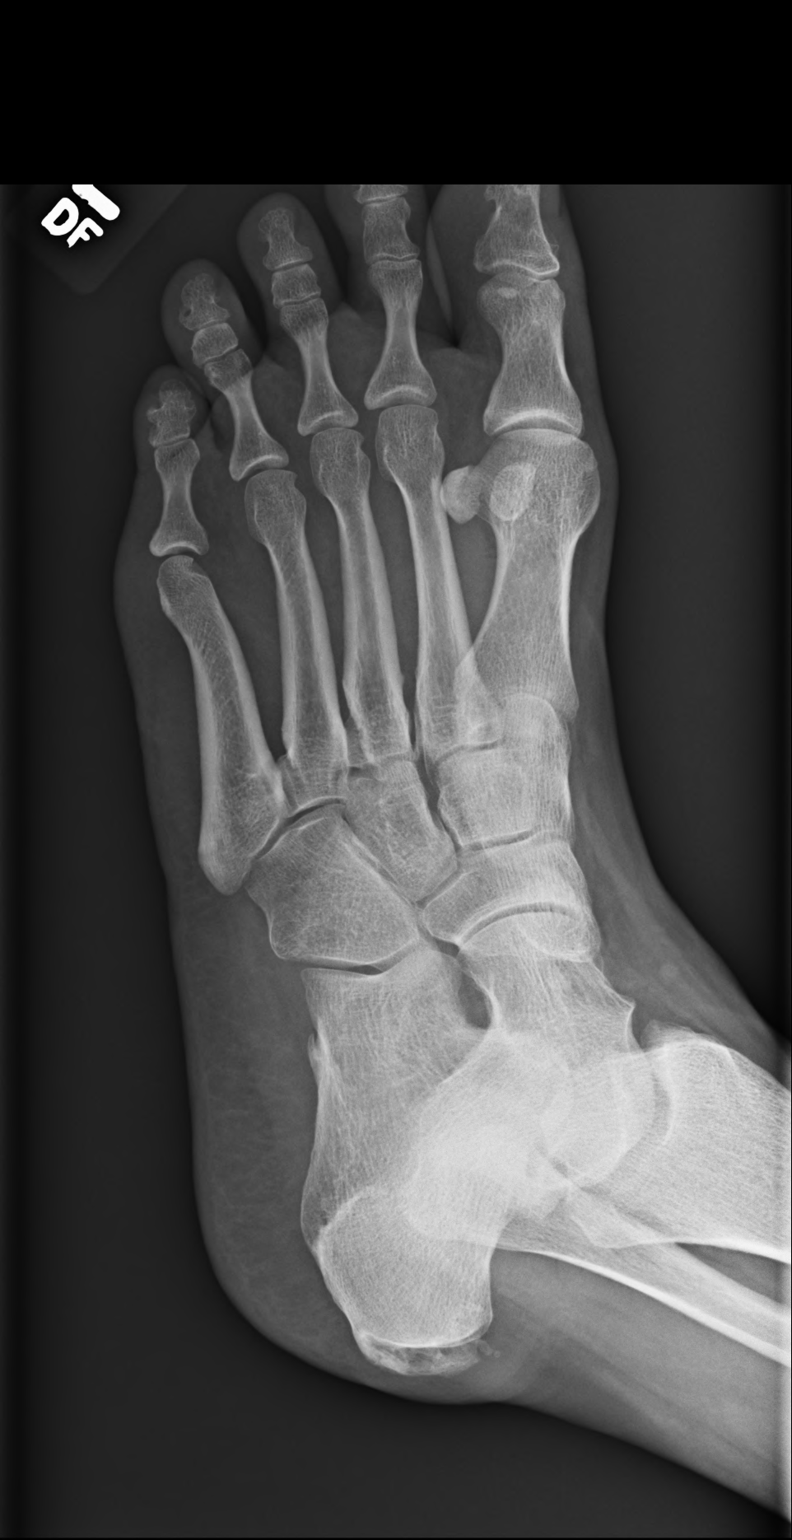
[im 3/3]
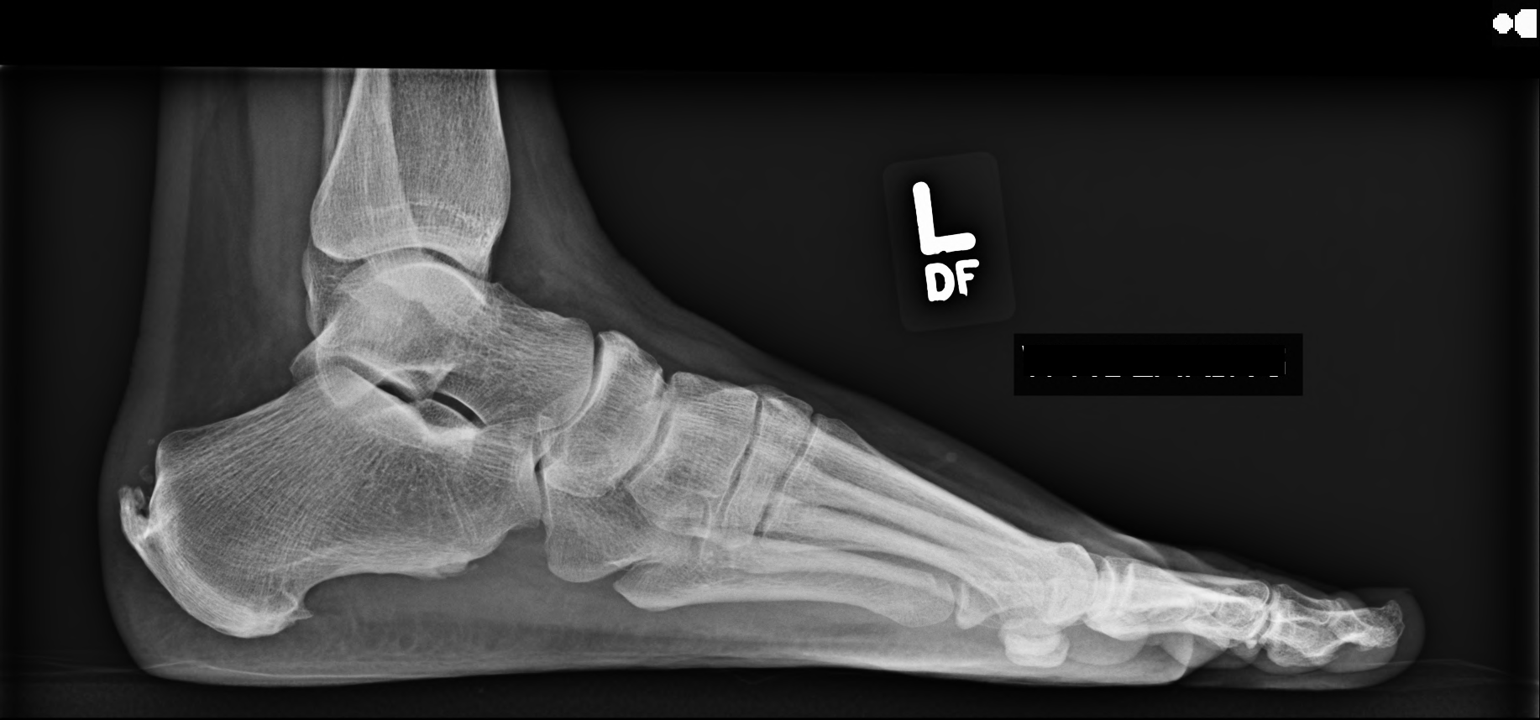

[3 of 3 positions shown; findings below may reference images not displayed]

FINDINGS: The foot is well mineralized. There is no fracture or dislocation. Joint spaces are normal. There is no foreign body.

There is a small plantar heel spur. There is a large enthesophyte at the attachment of the Achilles tendon to the os calcis.
IMPRESSION: Heel spurs otherwise normal left foot

## 2019-05-03 IMAGING — CR FOOT RT 3 VWS MIN
1 series · 3 of 3 positions shown · non-contrast
Comparison: Nothing of feet.

HISTORY: 44-year-old female, history bilateral foot pain.
TECHNIQUE: Right foot x-ray series 3 view.

[Series 1: ap · 0.17mm/px · 3 of 3 slices shown]
[im 1/3]
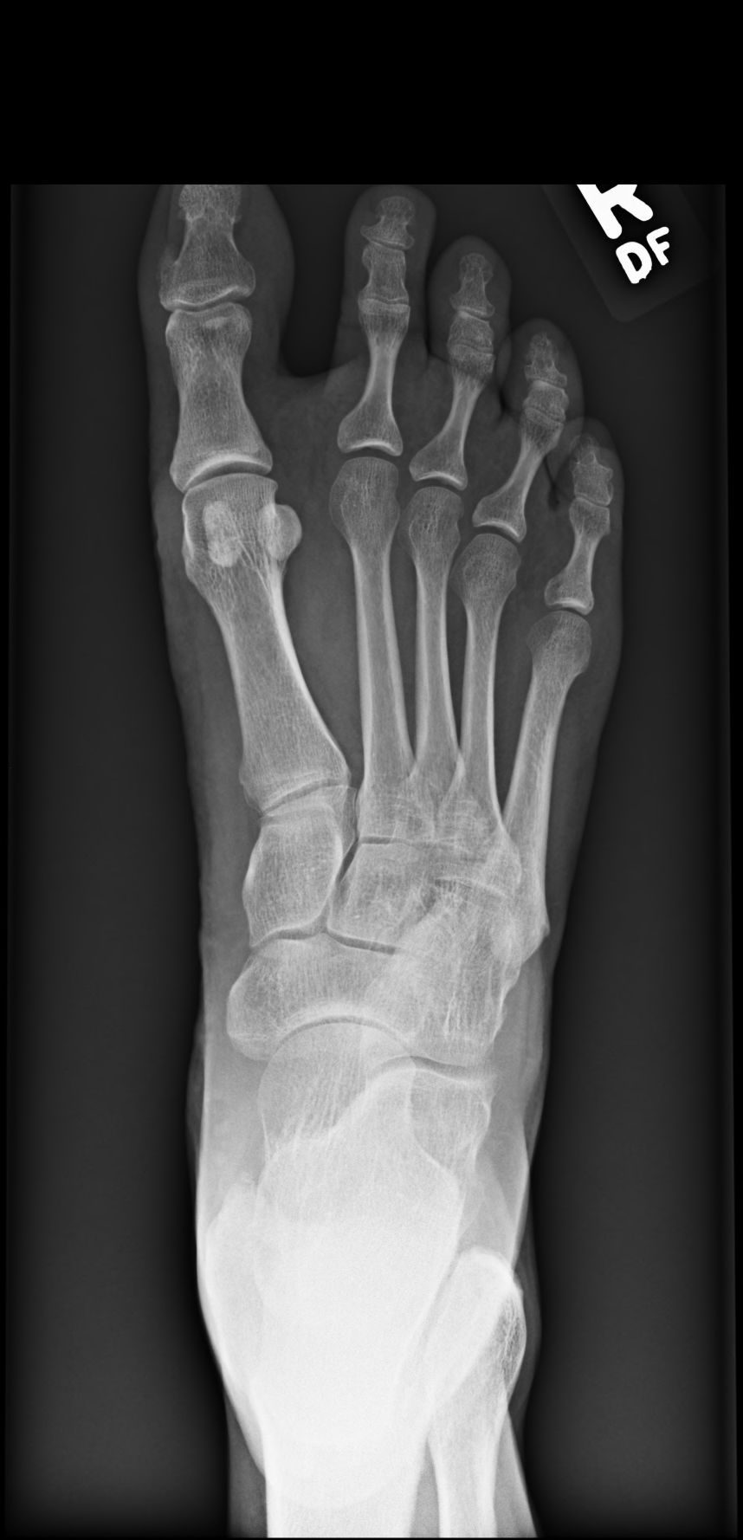
[im 2/3]
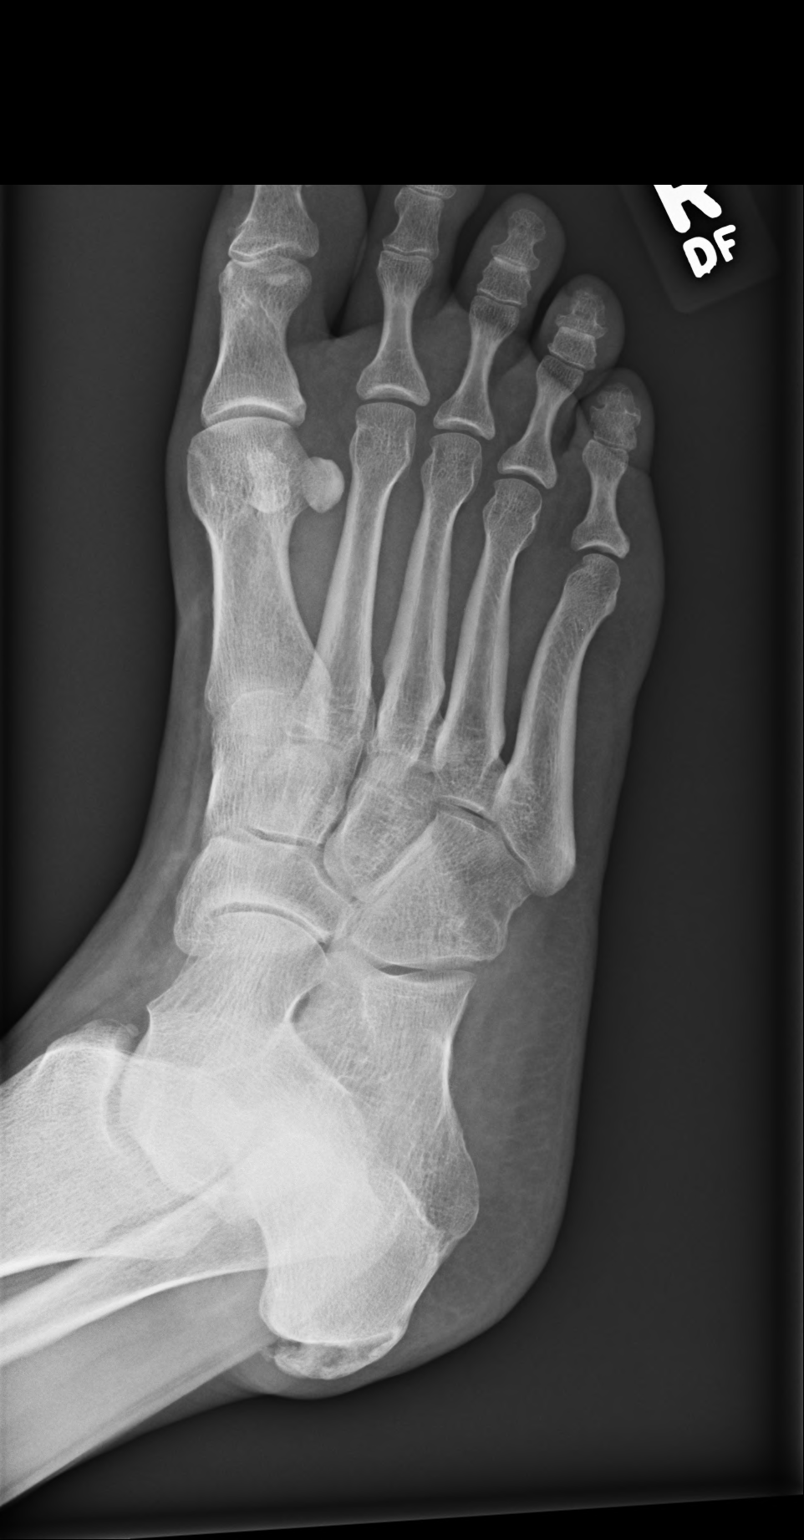
[im 3/3]
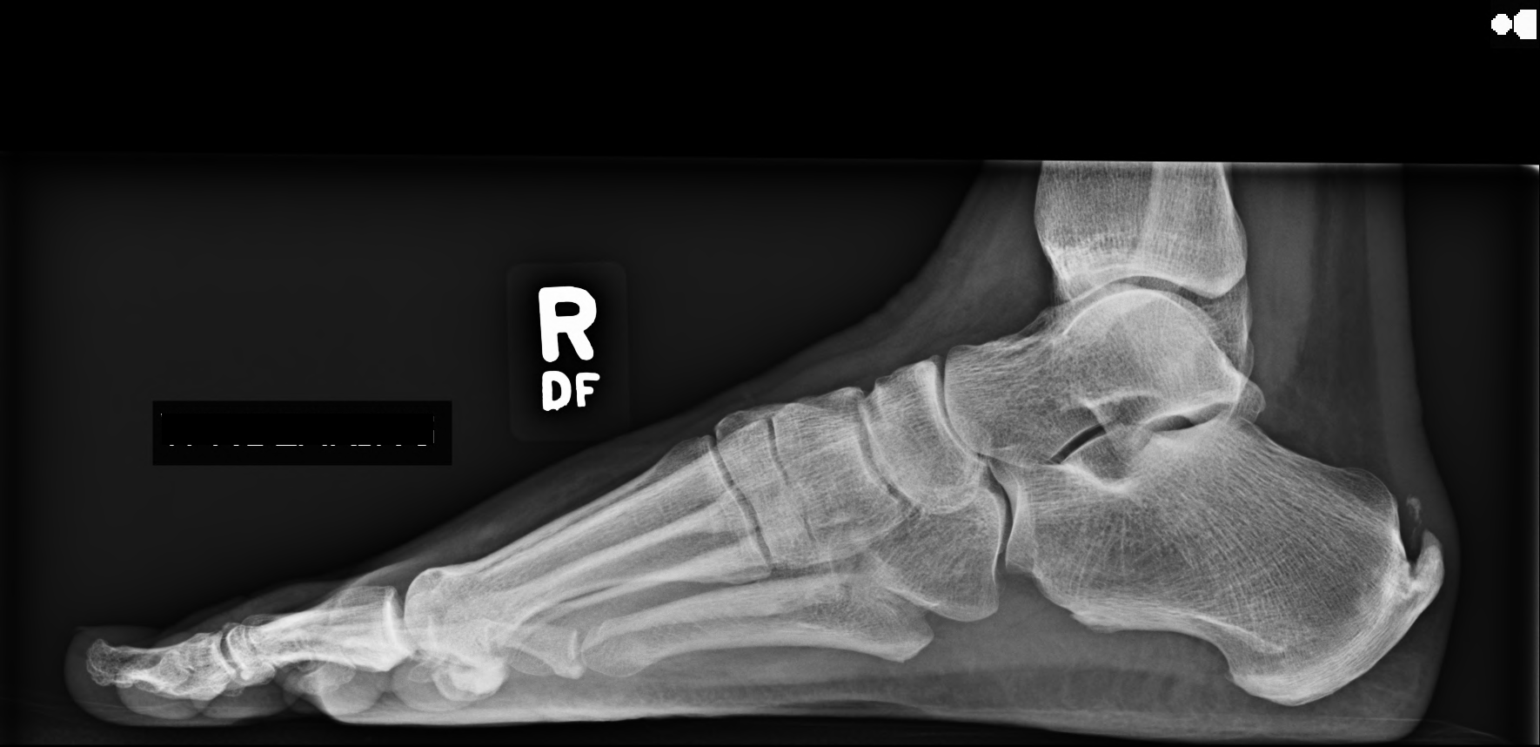

[3 of 3 positions shown; findings below may reference images not displayed]

FINDINGS: Achilles insertional spur and a pre-Achilles calcification are seen, overall measuring about 17 mm in length. Achilles tendon is thickened distally. Subtalar joint no obvious coalition. Good mineralization of foot. No destructive process seen. No erosions. No periosteal new bone. No radiopaque foreign body.
IMPRESSION: Achilles tendinosis with prominent Achilles insertional spur.

## 2019-10-28 ENCOUNTER — Ambulatory Visit
Admit: 2019-10-28 | Discharge: 2019-10-28 | Payer: PRIVATE HEALTH INSURANCE | Attending: Obstetrics & Gynecology | Primary: Family Medicine

## 2019-10-28 ENCOUNTER — Ambulatory Visit: Attending: Obstetrics & Gynecology | Primary: Family Medicine

## 2019-10-28 DIAGNOSIS — R102 Pelvic and perineal pain: Secondary | ICD-10-CM

## 2019-10-28 NOTE — Progress Notes (Signed)
The patient is a 44 y.o. G2 P2002 who is seen for possible vaginal prolapse. States she feels like she is sitting on an egg. States it buldges more when coughing or sneezing. Complains that when she has to urinate there is not much time.   Has a hysterectomy 3 years ago, had severe adhesions from bladder to uterus , secondary to Hx of C sections. Still has ovaries.  HISTORY:    G2 P2002  Patient's last menstrual period was 11/09/2016 (exact date).  Sexual History:  has sex with males  Contraception:  Hysterectomy   Current Outpatient Medications on File Prior to Visit   Medication Sig Dispense Refill   ??? ALPRAZolam (XANAX) 0.5 mg tablet Take 1 Tab by mouth three (3) times daily as needed for Anxiety. Max Daily Amount: 1.5 mg. 270 Tab 1   ??? escitalopram oxalate (LEXAPRO) 20 mg tablet Take 1 Tab by mouth daily. 90 Tab 1   ??? buPROPion XL (WELLBUTRIN XL) 150 mg tablet Take 1 Tab by mouth every morning. 90 Tab 1   ??? ibuprofen (MOTRIN) 800 mg tablet Take 1 Tab by mouth every eight (8) hours as needed for Pain. 90 Tab 5     No current facility-administered medications on file prior to visit.       ROS:  Feeling well. No dyspnea or chest pain on exertion.  No abdominal pain, change in bowel habits, black or bloody stools.  Pos ( above) urinary tract symptoms. GYN ROS: no breast pain or new or enlarging lumps on self exam.    PHYSICAL EXAM:  Height 5\' 3"  (1.6 m), weight 136 lb 3.2 oz (61.8 kg), last menstrual period 11/09/2016.    The patient appears well, alert, oriented x 3, in no distress.   Abdomen soft without tenderness, guarding, mass or organomegaly.  Pelvic: VULVA: normal appearing vulva with no masses, tenderness or lesions, VAGINA: vaginal cyst present behind urethra about 3cm in diameter. This is where she feels the bulge. Does not have any real prolapse. , CERVIX: normal appearing cervix without discharge or lesions, surgically absent, UTERUS: surgically absent, vaginal cuff well healed, ADNEXA: normal adnexa in  size, nontender and no masses.    ASSESSMENT:      ICD-10-CM ICD-9-CM    1. Pelvic pain in female  R10.2 625.9      Encounter Diagnoses   Name Primary?   ??? Pelvic pain in female Yes           Diagnoses and all orders for this visit:    1. Pelvic pain in female      Follow-up and Dispositions    ?? Return if symptoms worsen or fail to improve.       the following changes in treatment are made: I am referring her to Urology. 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  PLAN:  Diagnosis explained in detail, including differential. Understands may need surgery that we do not offer, will consult with the urologist.  All questions answered.  Follow-up as needed here

## 2019-10-28 NOTE — Addendum Note (Signed)
Addendum  Note by Abigail Miyamoto, MD at 10/28/19 1030                Author: Abigail Miyamoto, MD  Service: --  Author Type: Physician       Filed: 10/28/19 1841  Encounter Date: 10/28/2019  Status: Signed          Editor: Abigail Miyamoto, MD (Physician)          Addended by: Abigail Miyamoto on: 10/28/2019 06:41 PM    Modules accepted: Orders

## 2019-10-29 ENCOUNTER — Telehealth

## 2019-10-29 NOTE — Telephone Encounter (Signed)
 Pt calls wanting to make sure that the referral to urology was made yesterday.  She is aware that referral has been done. Dr Milford wants her to see a MD not NP as she may need to have surgery. Voiced full understanding.   Orders Placed This Encounter   . REFERRAL TO UROLOGY     Referral Priority:   Routine     Referral Type:   Consultation     Referral Reason:   Specialty Services Required     Requested Specialty:   Urology     Number of Visits Requested:   1

## 2019-11-12 ENCOUNTER — Ambulatory Visit: Payer: PRIVATE HEALTH INSURANCE | Primary: Family Medicine

## 2019-11-22 IMAGING — CR T-SPINE 4 VWS MIN
1 series · 5 of 5 positions shown · non-contrast
Comparison: None

HISTORY: Pain in thoracic spine
TECHNIQUE: Thoracic spine 5 views

[Series 1: ap · 0.17mm/px · 5 of 5 slices shown]
[im 1/5]
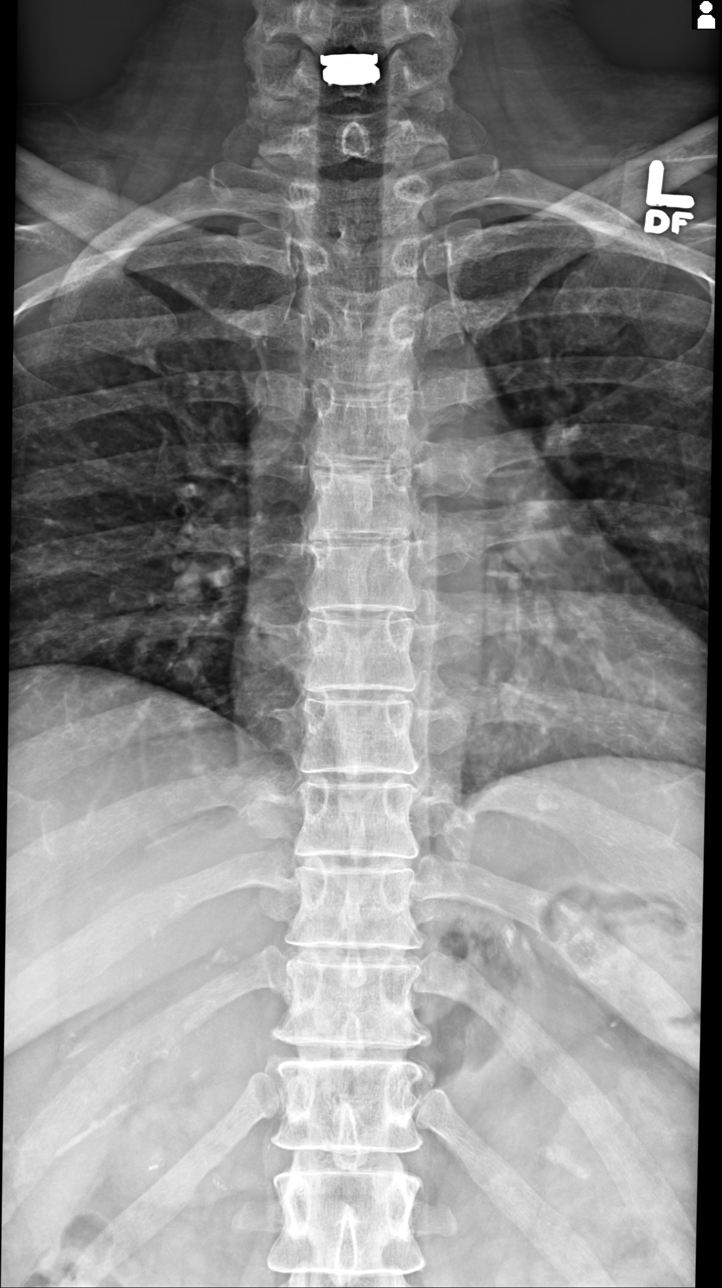
[im 2/5]
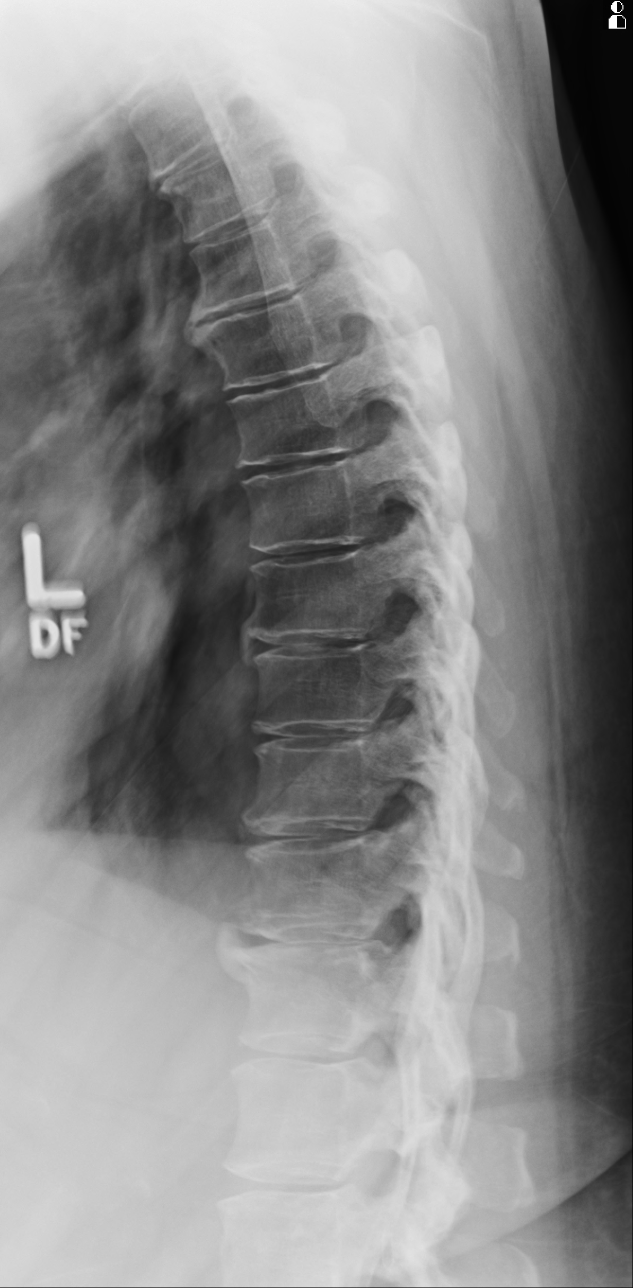
[im 3/5]
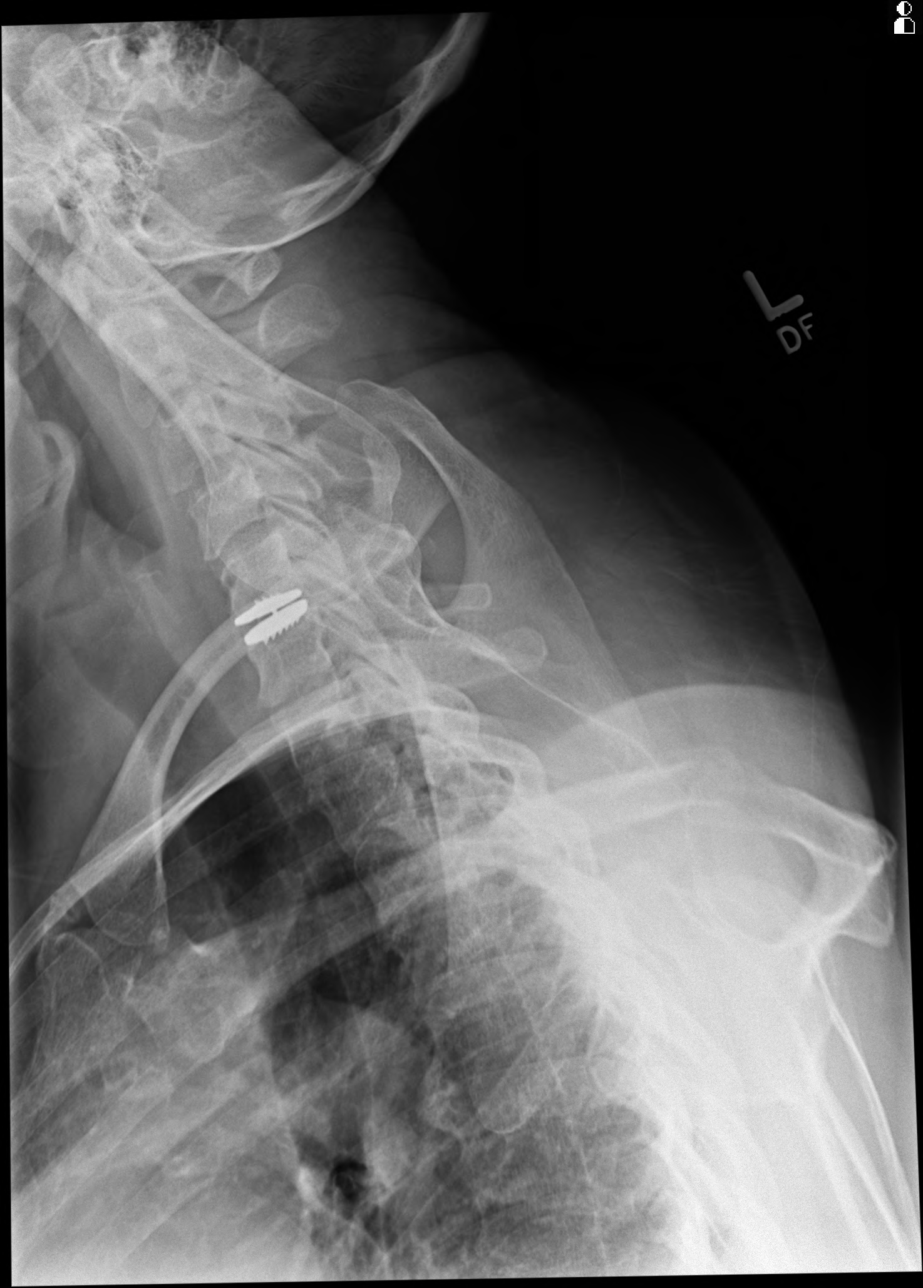
[im 4/5]
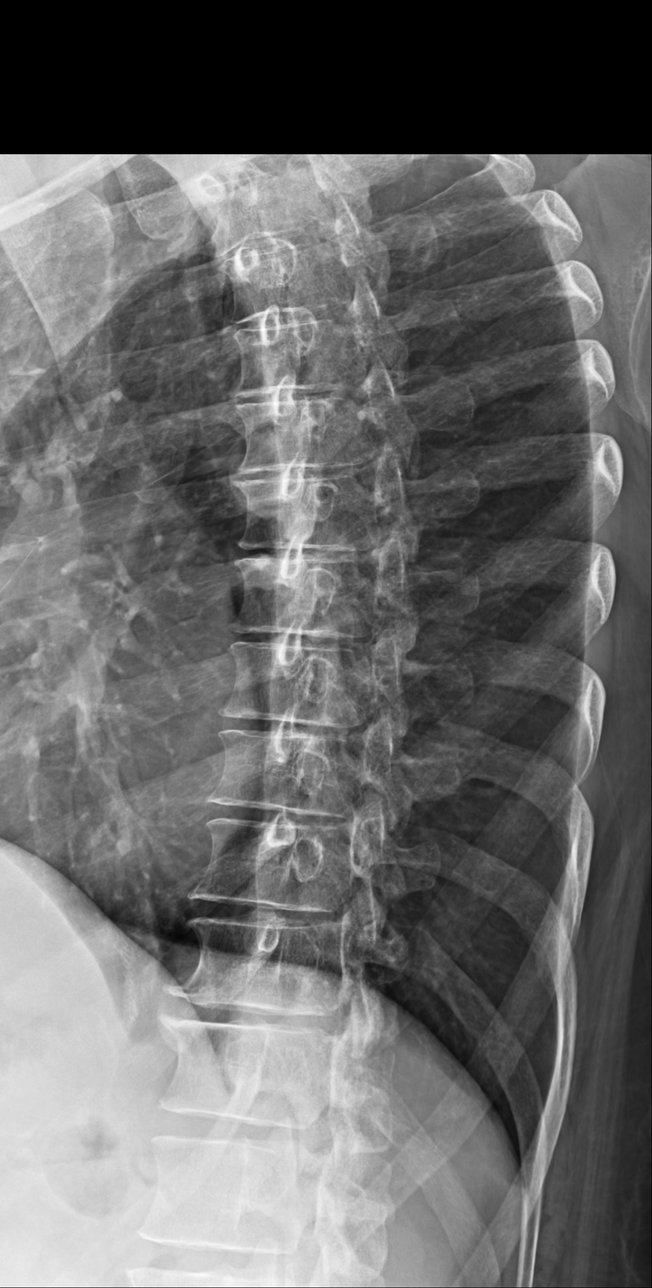
[im 5/5]
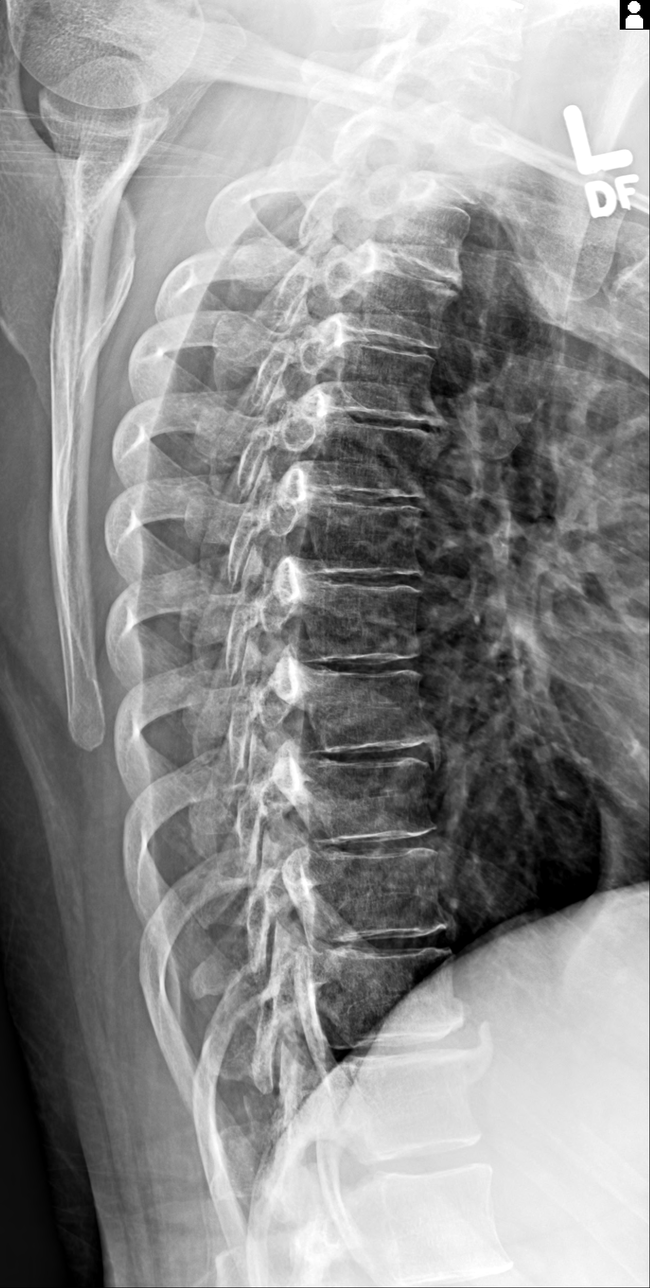

[5 of 5 positions shown; findings below may reference images not displayed]

FINDINGS: 5 view thoracic spine demonstrates normal alignment of thoracic segments. No fractures. No destructive lesions. There is multilevel degenerative loss of disc space height with associated marginal endplate osteophytes at multiple levels. Mild facet arthropathy. Postsurgical changes lower cervical spine. Paravertebral soft tissues unremarkable.
IMPRESSION: Mild degenerative disc disease of thoracic spine. Normal alignment. No fractures or destructive lesions.

## 2019-12-03 IMAGING — US US ABDOMEN COMPLETE
1 series · 14 of 25 positions shown · non-contrast
Comparison: None

HISTORY: Epigastric pain.
TECHNIQUE: Multiple real-time ultrasound images of the abdomen were obtained

[Series 1: us abdomen complete · 14 of 89 slices shown]
[im 1/89]
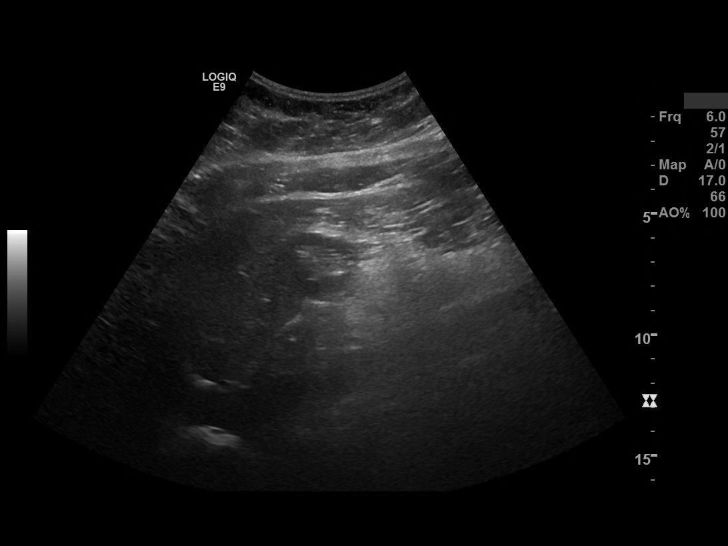
[im 8/89]
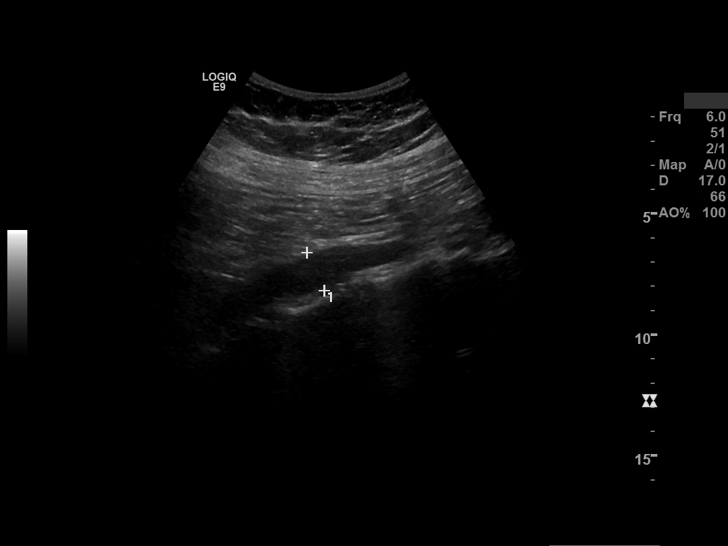
[im 15/89]
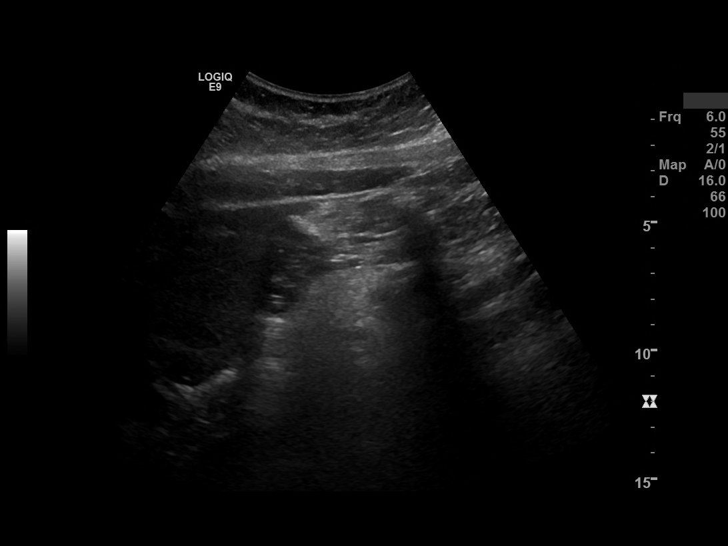
[im 23/89]
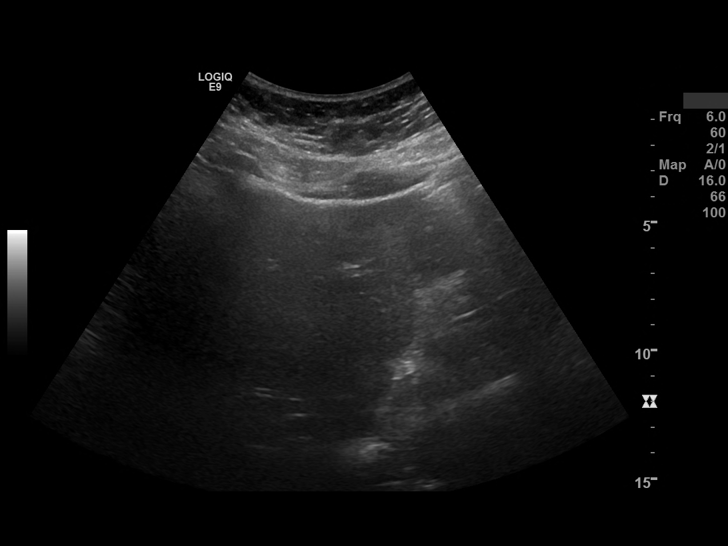
[im 30/89]
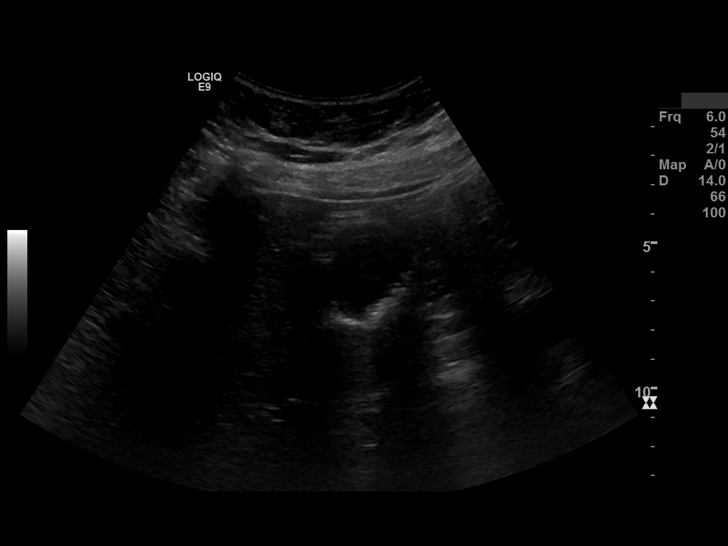
[im 34/89]
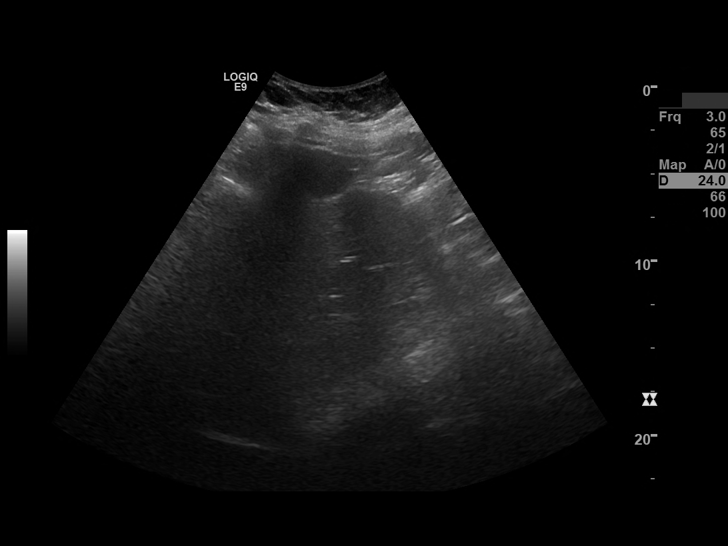
[im 41/89]
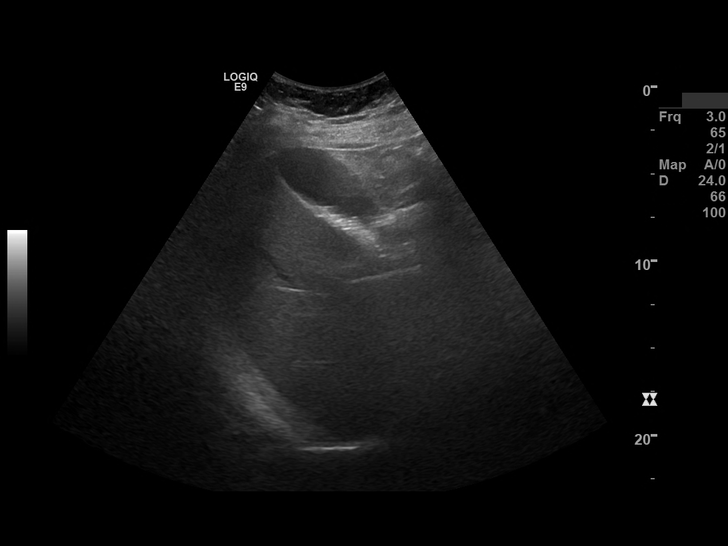
[im 48/89]
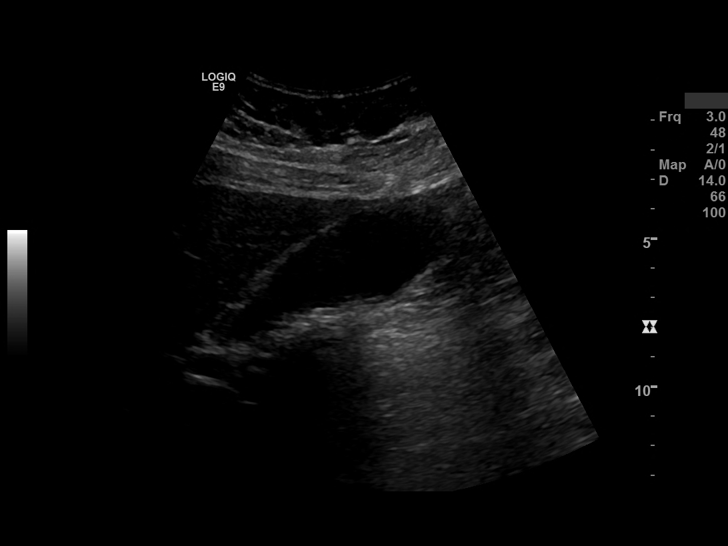
[im 56/89]
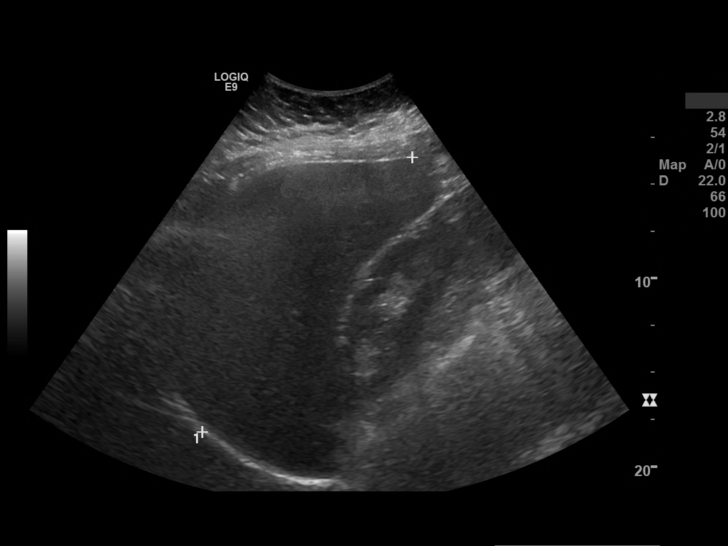
[im 59/89]
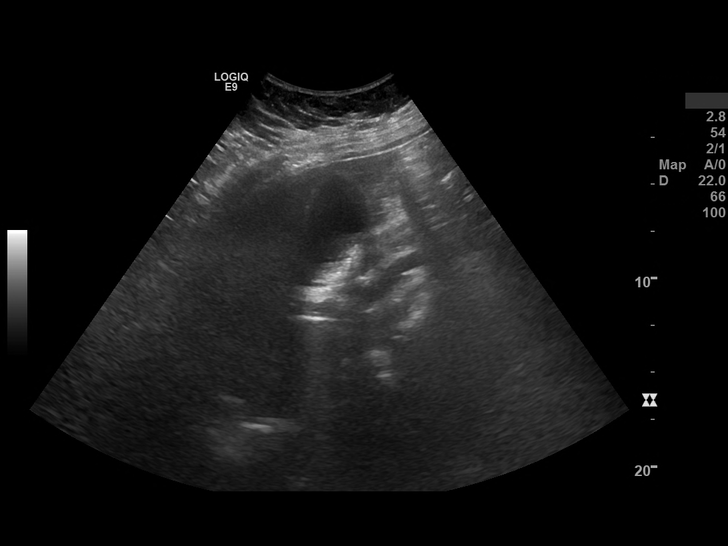
[im 67/89]
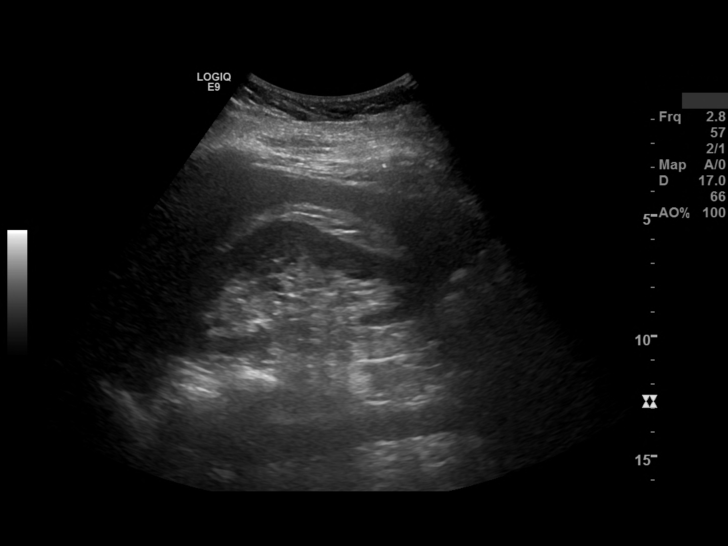
[im 74/89]
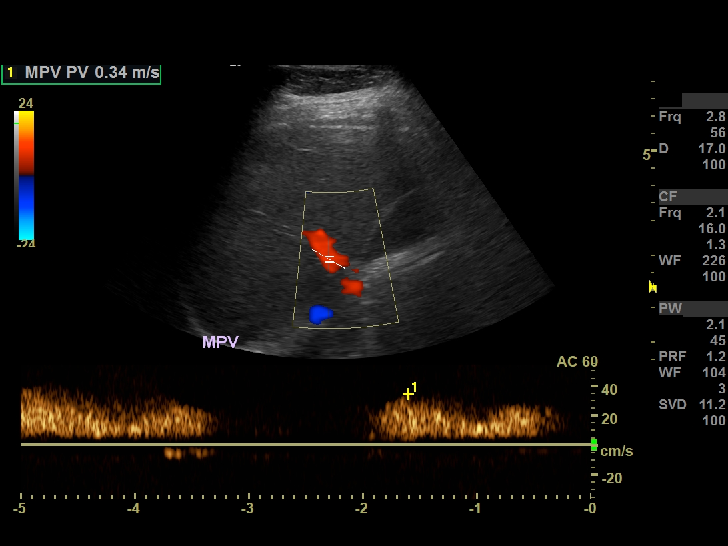
[im 81/89]
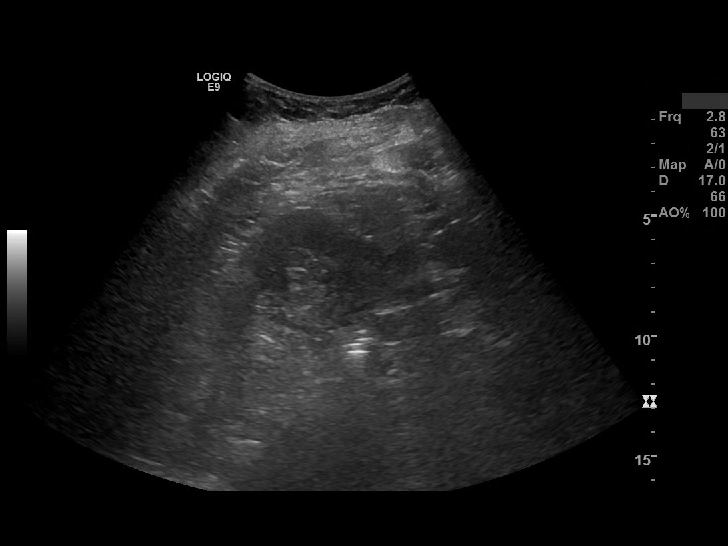
[im 89/89]
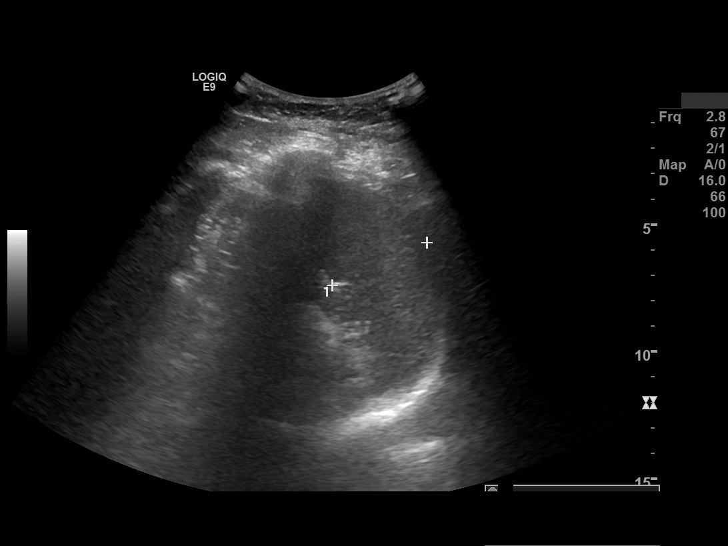

[14 of 25 positions shown; findings below may reference images not displayed]

FINDINGS: Liver: The right lobe of the liver is mildly enlarged measuring 184 mm at the midclavicular line. Echotexture of the liver is normal. The portal vein is 10 mm in diameter and exhibits normal directional blood flow.

Gallbladder: Well-visualized. There are multiple small calculi layered out posteriorly that moves with gravity and produce acoustic shadowing. Gallbladder wall thickness 2.6 mm. Sonographic Murphy's sign negative. Common bile duct 4.7 mm.

Spleen: 96 x 45 x 41 mm. Unremarkable.

Right kidney: 111 x 53 x 57 mm. There is no stone, mass or hydronephrosis.

Left kidney: 105 x 51 x 53 mm. There is no stone, mass or hydronephrosis.

Pancreas: The head and body of the pancreas are normal in appearance. The tail is obscured by bowel gas.

Aorta: The complete abdominal aorta is visualized. There is no aneurysm.

IVC: Patent. Unremarkable.
IMPRESSION: 1. The liver appears enlarged measuring 184 mm at the midclavicular line. Otherwise normal.

2. Cholelithiasis.

## 2020-07-20 IMAGING — MG MAMMO SCRN BIL W/CAD TOMO
8 series · 8 of 24 positions shown · non-contrast
Comparison: The present examination has been compared to prior imaging studies.

Images Obtained from [facility]
INDICATION: Screening.
TECHNIQUE: Bilateral 2-D digital screening mammogram was performed followed by 3-D tomosynthesis.  Current study was also evaluated with a computer aided detection (CAD) system.
MAMMOGRAM FINDINGS:
There are scattered areas of fibroglandular density.
No suspicious abnormality is seen in either breast.  There are no significant changes from the prior study.

[R MLO]
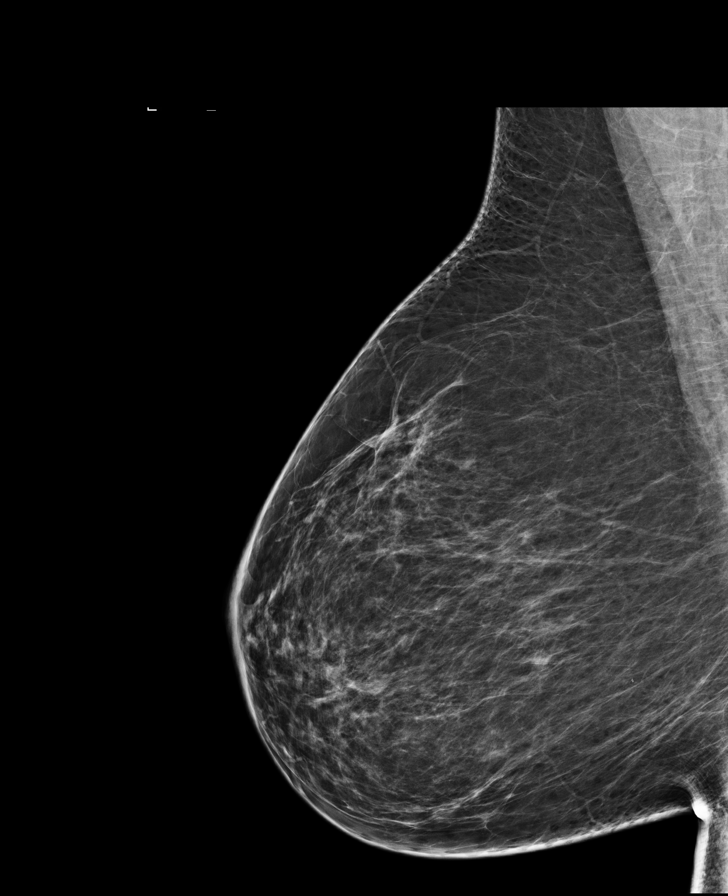

[L CC]
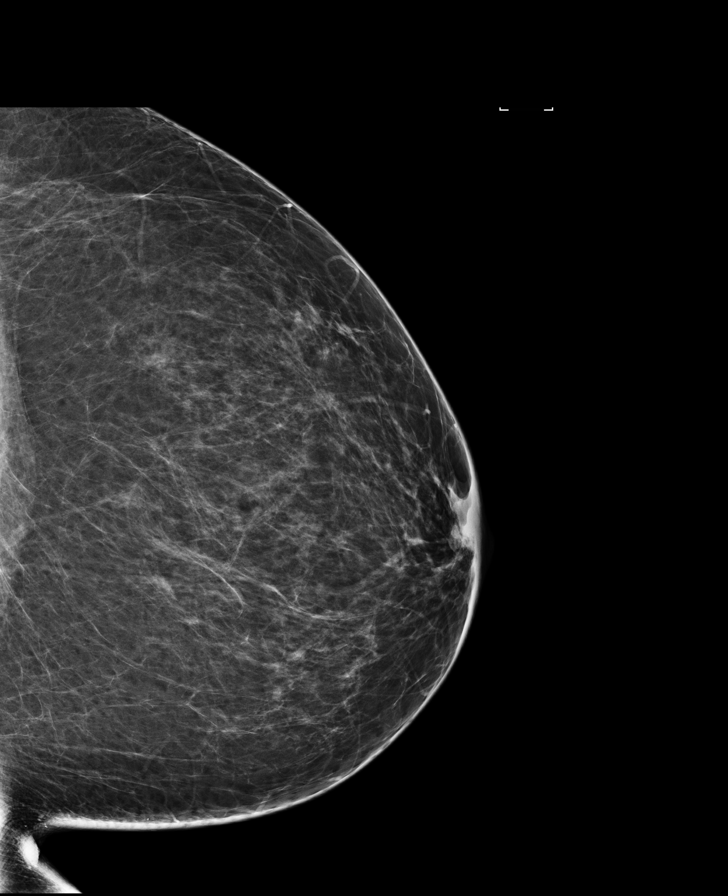

[L MLO]
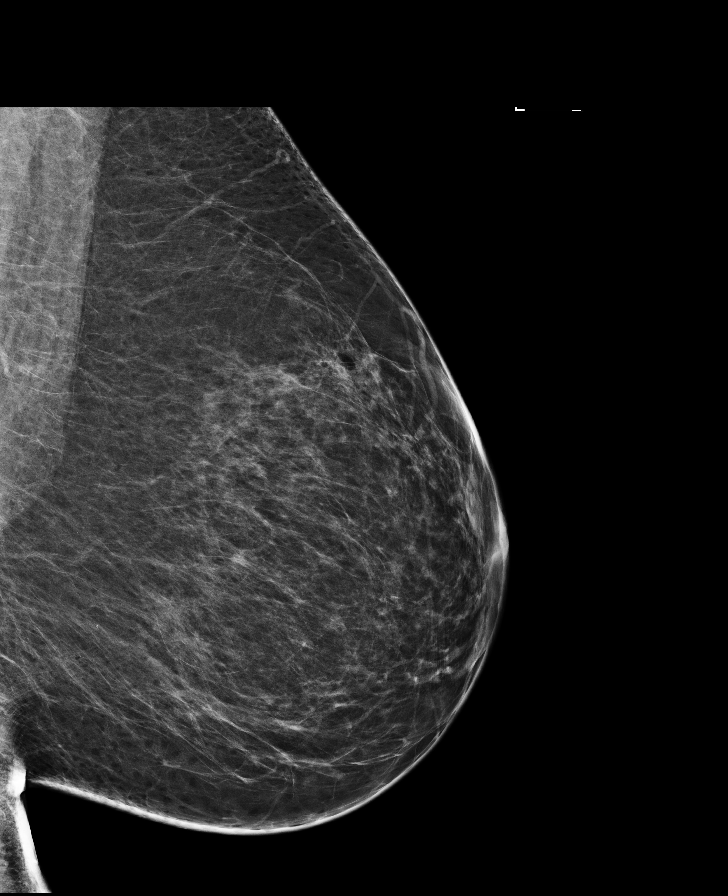

[R CC]
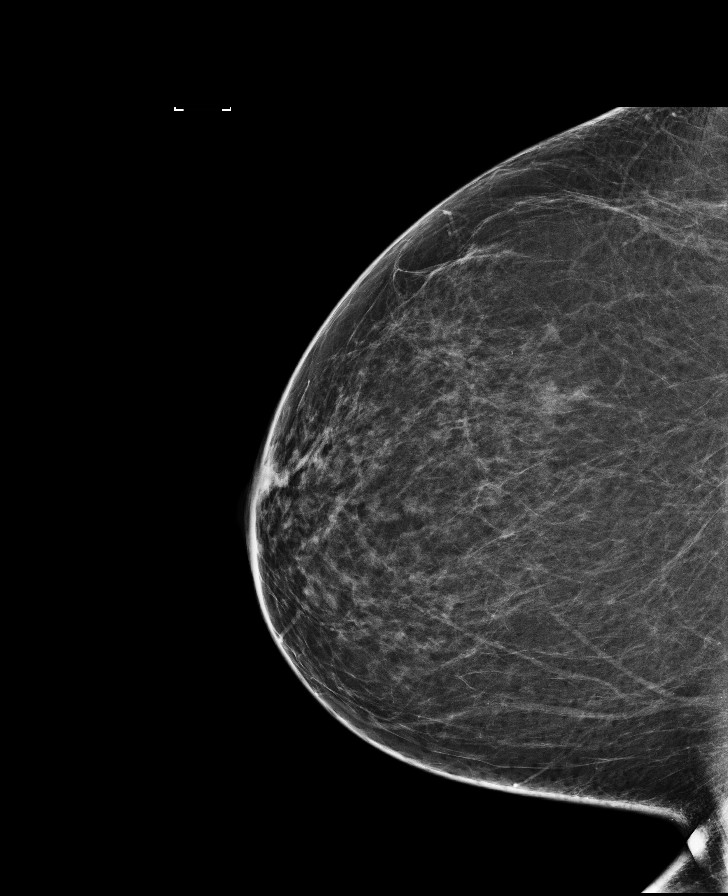

[R MLO tomo · tomo slice 41/81.0]
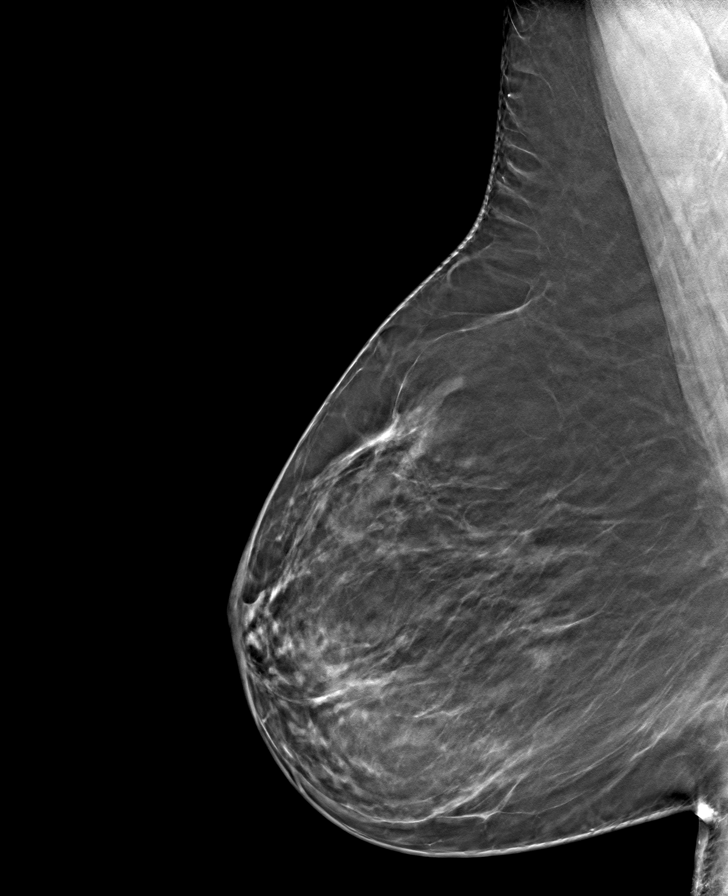

[L CC tomo · tomo slice 41/80.0]
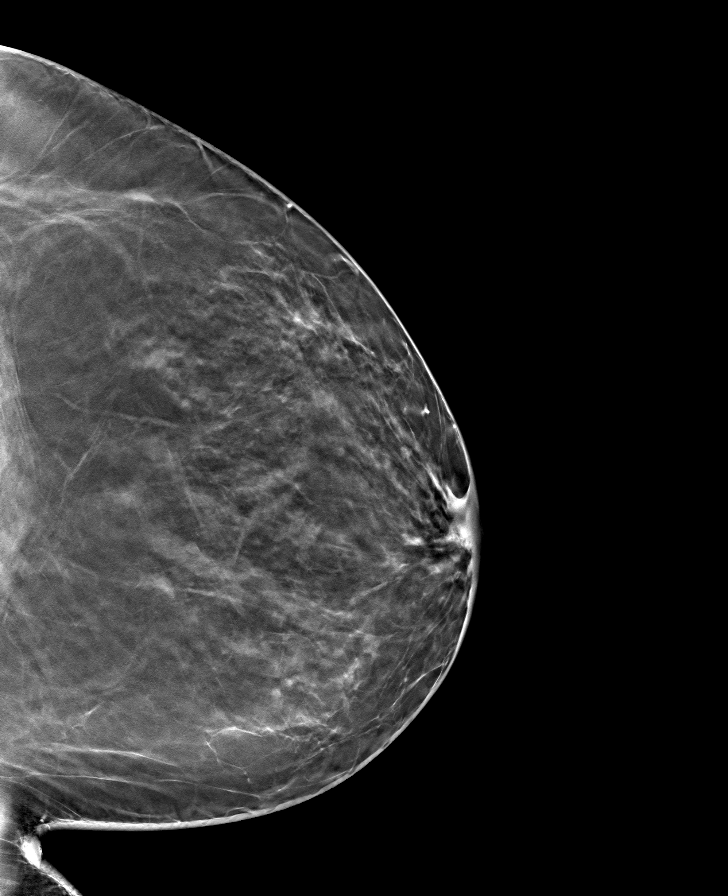

[L MLO tomo · tomo slice 43/84.0]
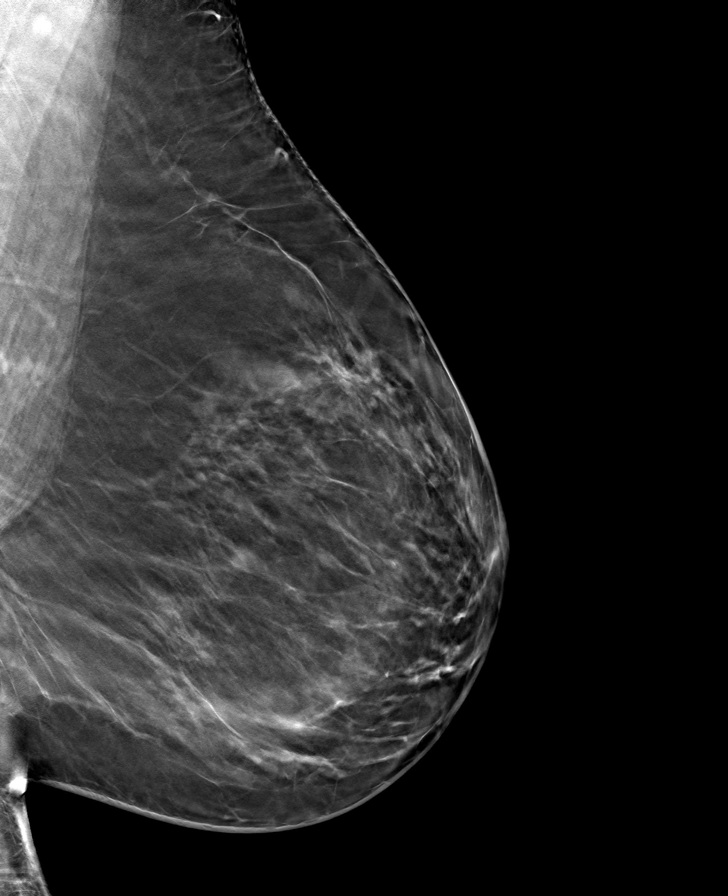

[R CC tomo · tomo slice 38/75.0]
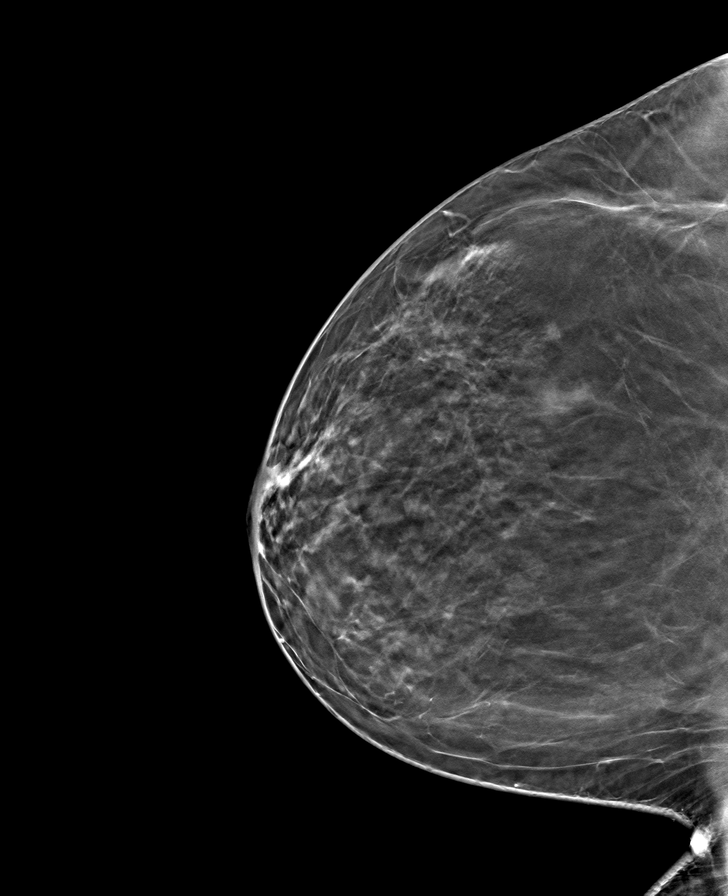

[8 of 24 positions shown; findings below may reference images not displayed]

IMPRESSION: There is no mammographic evidence of malignancy.
Screening mammogram recommended in 1 year.
BI-RADS Category 1: Negative

## 2020-08-10 IMAGING — US DOP CAROTID BILATERAL
1 series · 13 of 24 positions shown · non-contrast
Comparison: None

HISTORY: 45-year-old female with syncope
TECHNIQUE: Gray-scale color Doppler and spectral Doppler imaging of the bilateral carotid and vertebral arteries is performed.

[Series 1: dop carotid bilateral · 13 of 54 slices shown]
[im 1/54]
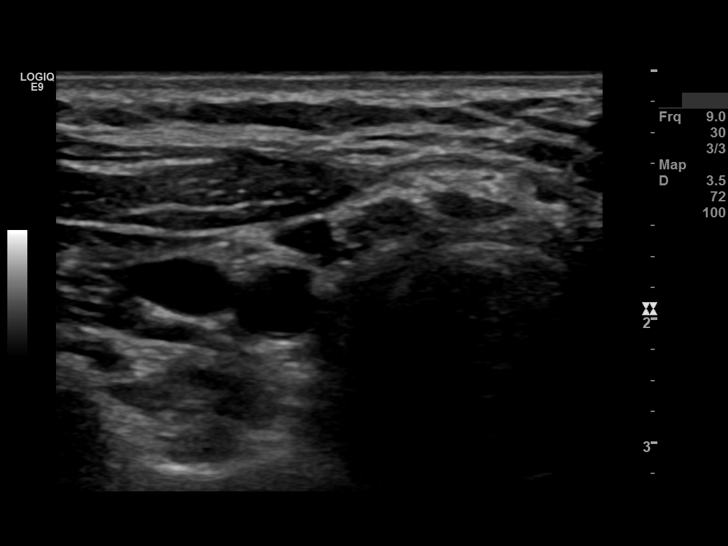
[im 5/54]
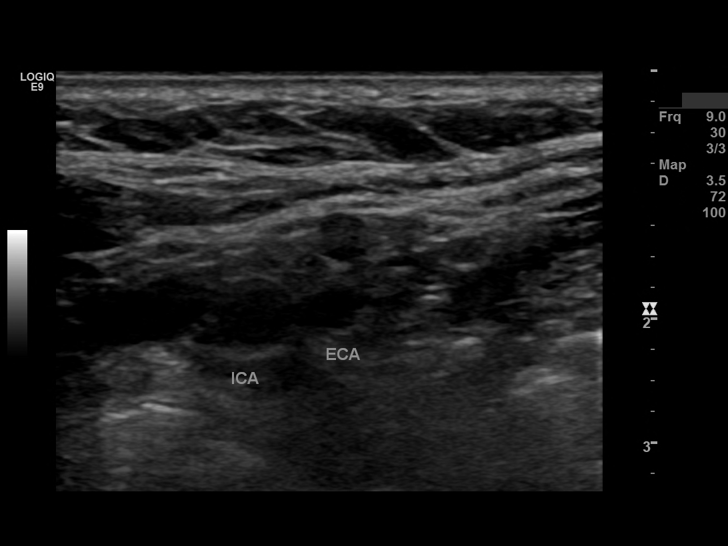
[im 10/54]
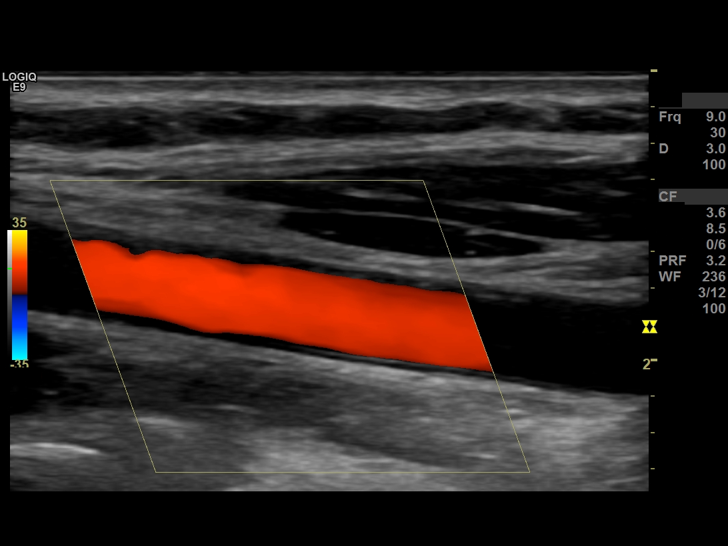
[im 14/54]
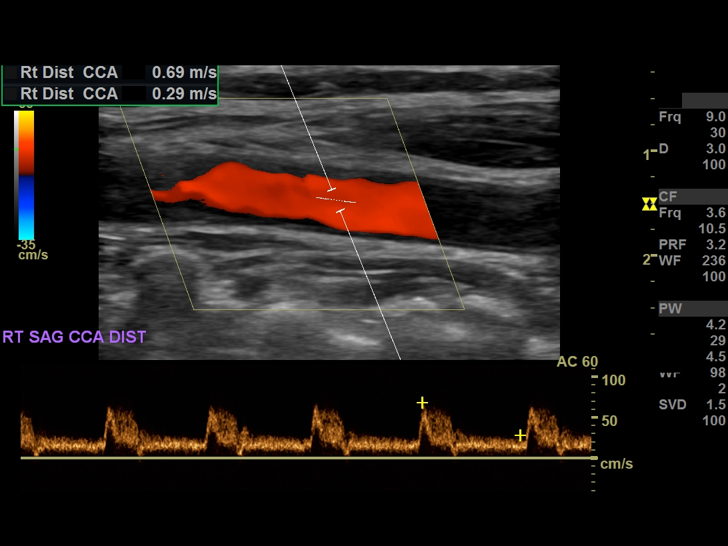
[im 19/54]
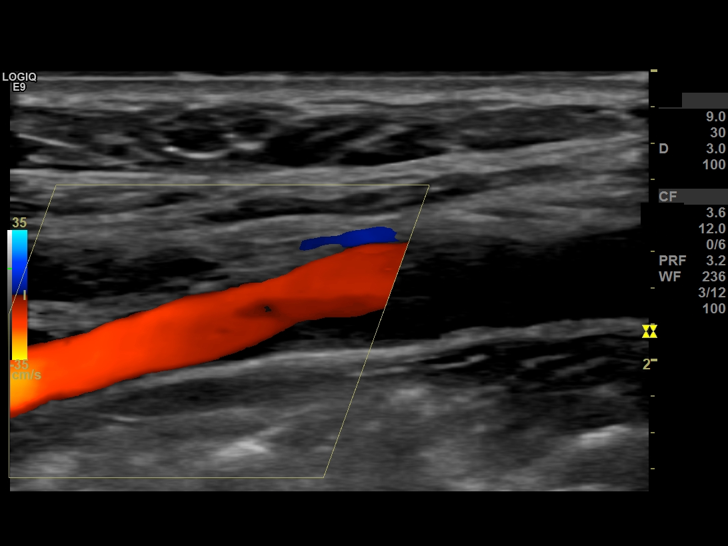
[im 24/54]
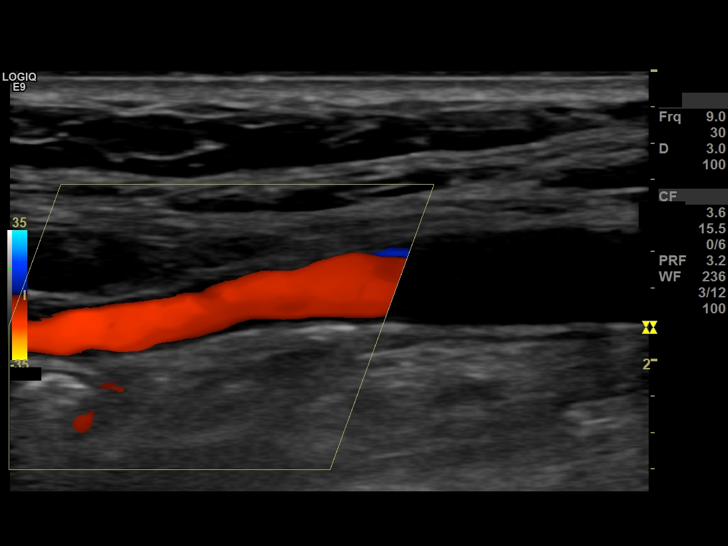
[im 28/54]
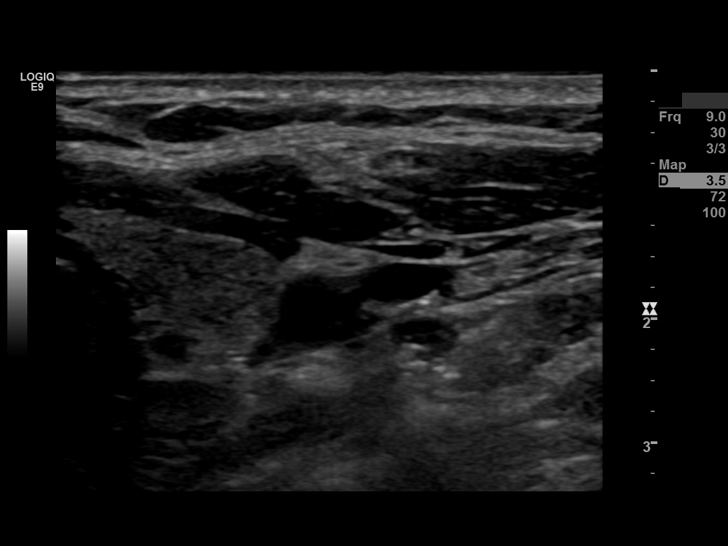
[im 30/54]
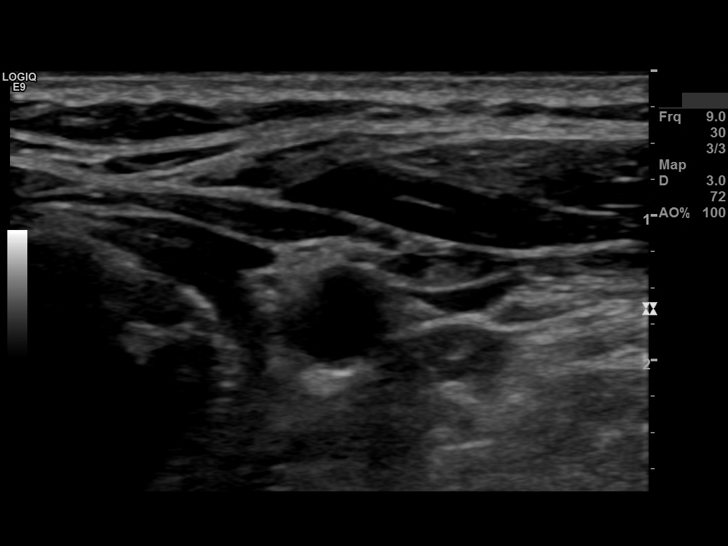
[im 35/54]
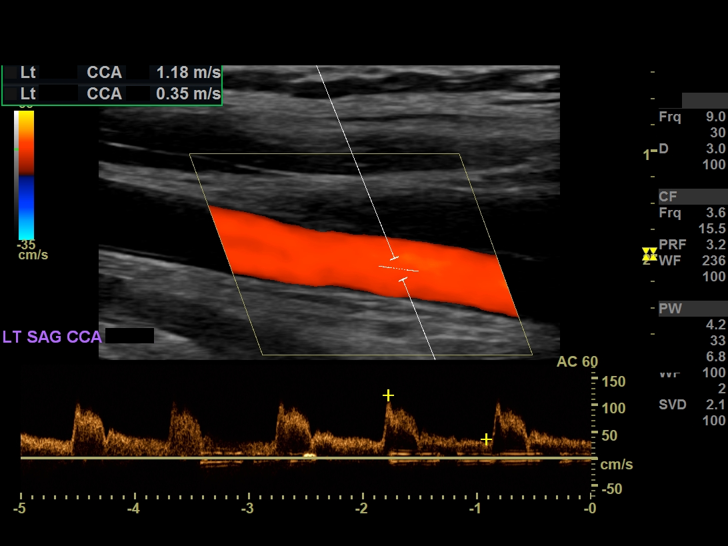
[im 40/54]
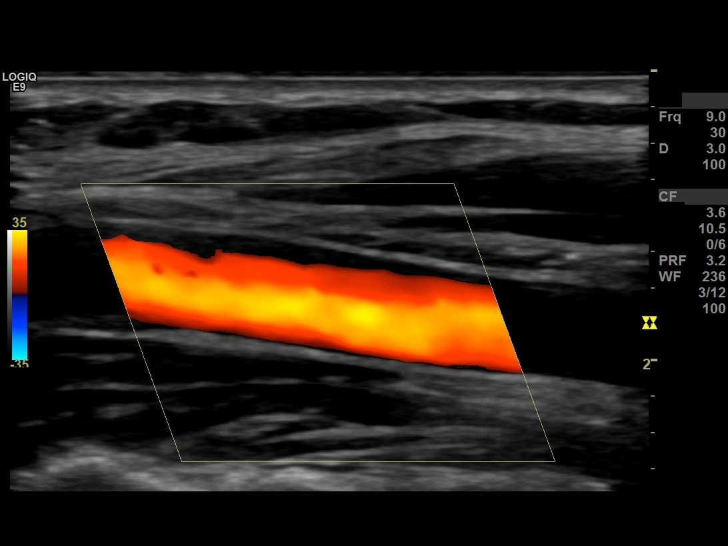
[im 44/54]
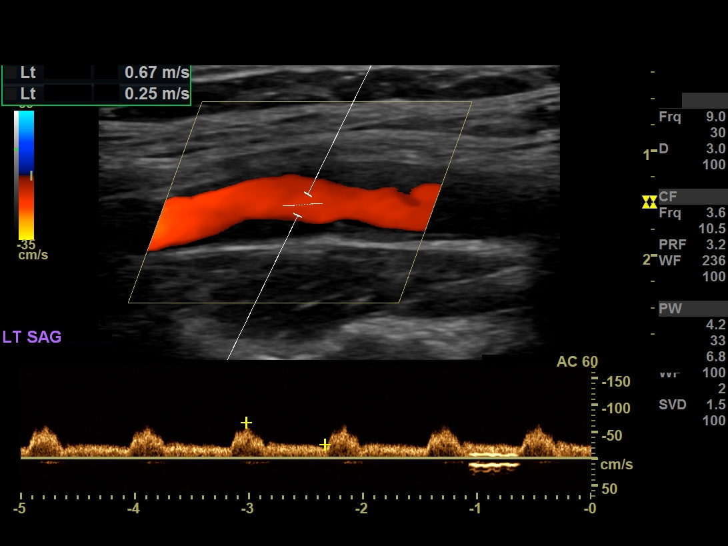
[im 49/54]
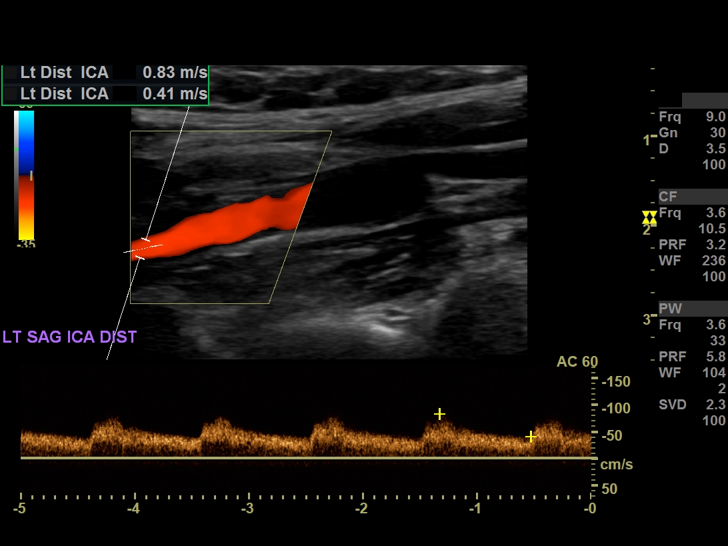
[im 54/54]
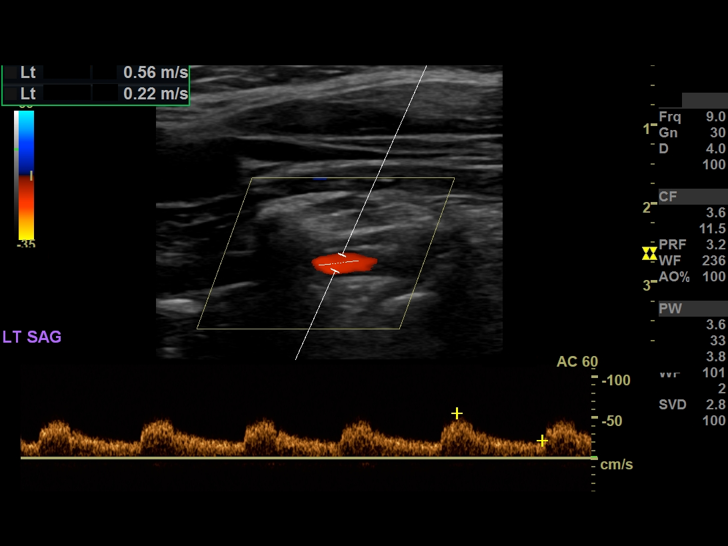

[13 of 24 positions shown; findings below may reference images not displayed]

FINDINGS: Gray-scale soft/calcified carotid plaques:  There are no significant plaques identified in both carotid arteries. 

CARDIAC RHYTHM: REGULAR

RIGHT CAROTID ARTERY:

ICA peak systolic velocity: 0.98  m/s

CCA peak systolic velocity: 0.92 m/s

ICA/CCA Ratio:

ICA end-diastolic velocity: 0.50 m/s

ECA velocity:  0.92 m/s

Vertebral artery peak systolic velocity: 0.49 m/s

Vertebral artery direction: ANTEGRADE

LEFT CAROTID ARTERY:

ICA peak systolic velocity: 0.83 m/s

CCA peak systolic velocity: 1.18 m/s

ICA/CCA Ratio:

ICA end-diastolic velocity: 0.41 m/s

ECA velocity:  0.71 m/s

Vertebral artery peak systolic velocity: 0.56 m/s

Vertebral artery direction: ANTEGRADE
IMPRESSION: 1.  CARDIAC RHYTHM: REGULAR

2.  Gray-scale soft/calcified carotid plaques:  There are no significant plaques identified in both carotid arteries. 

3.  No hemodynamically significant stenosis of the carotid arteries.

Stenosis velocity criteria are extrapolated from diameter data as defined by the Society of Radiologists in Ultrasound Consensus Conference Radiology 5338; 229; [phone]

## 2021-04-16 IMAGING — CR SHOULDER RT 2-3 VWS
1 series · 3 of 3 positions shown · non-contrast
Comparison: None

INDICATION: Pain in right shoulder.
TECHNIQUE: Right shoulder radiographs, 3 views.

[Series 1: internal rotate · 0.17mm/px · 3 of 3 slices shown]
[im 1/3]
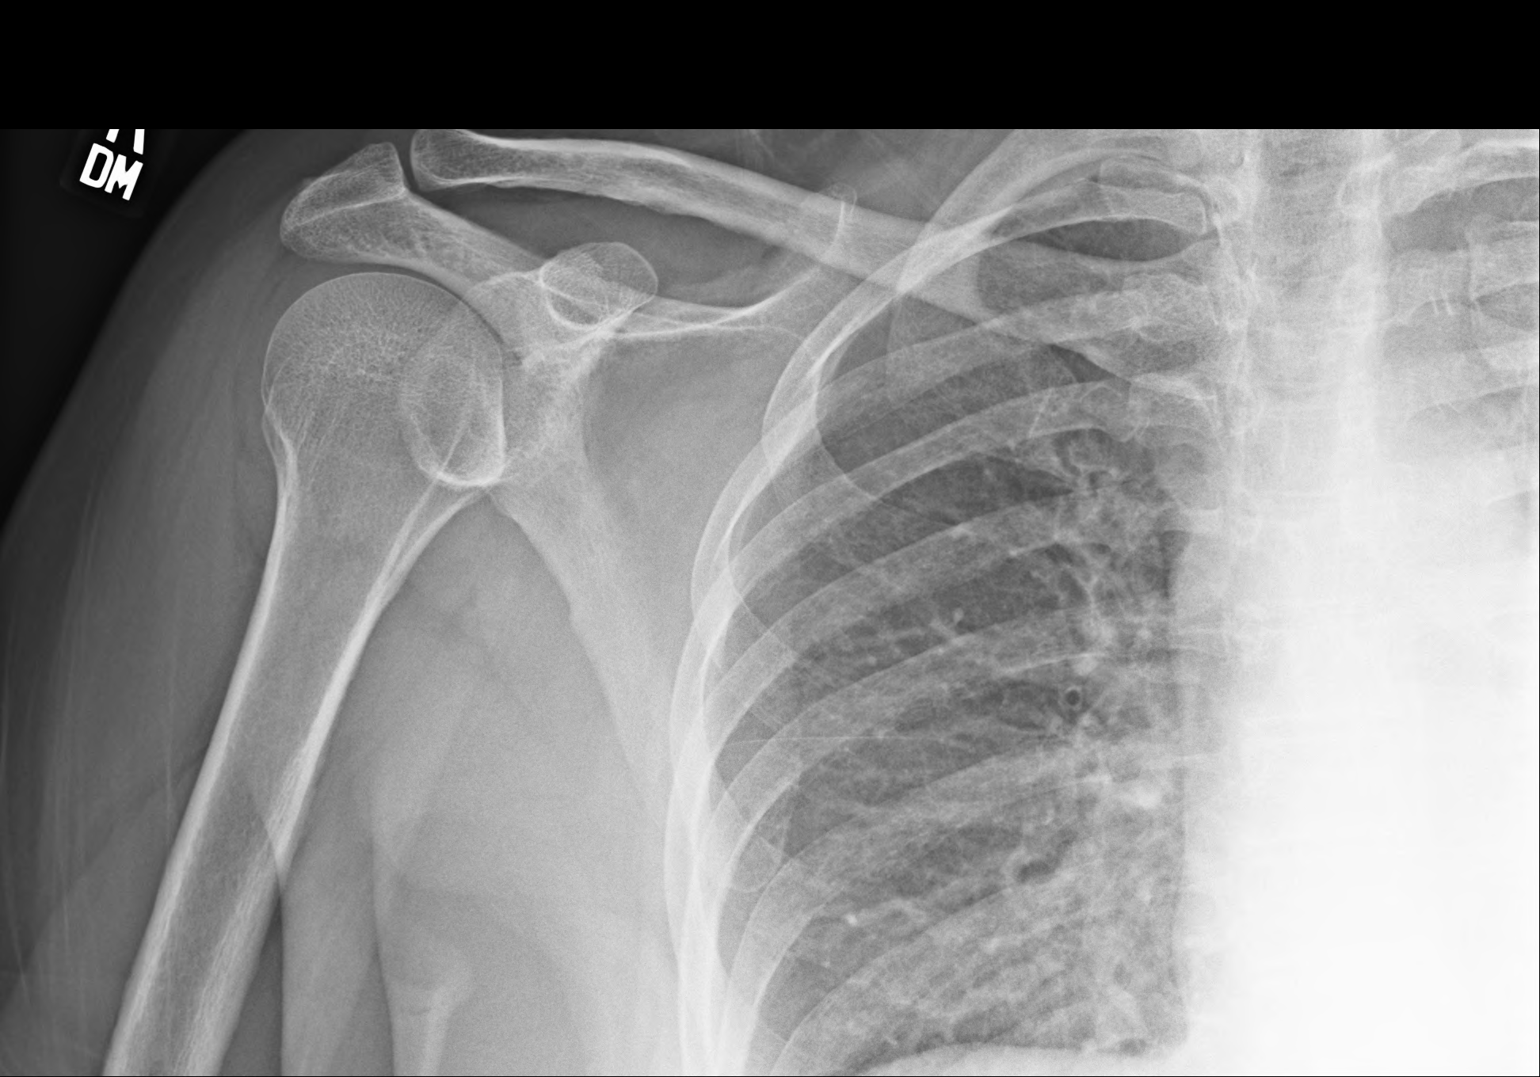
[im 2/3]
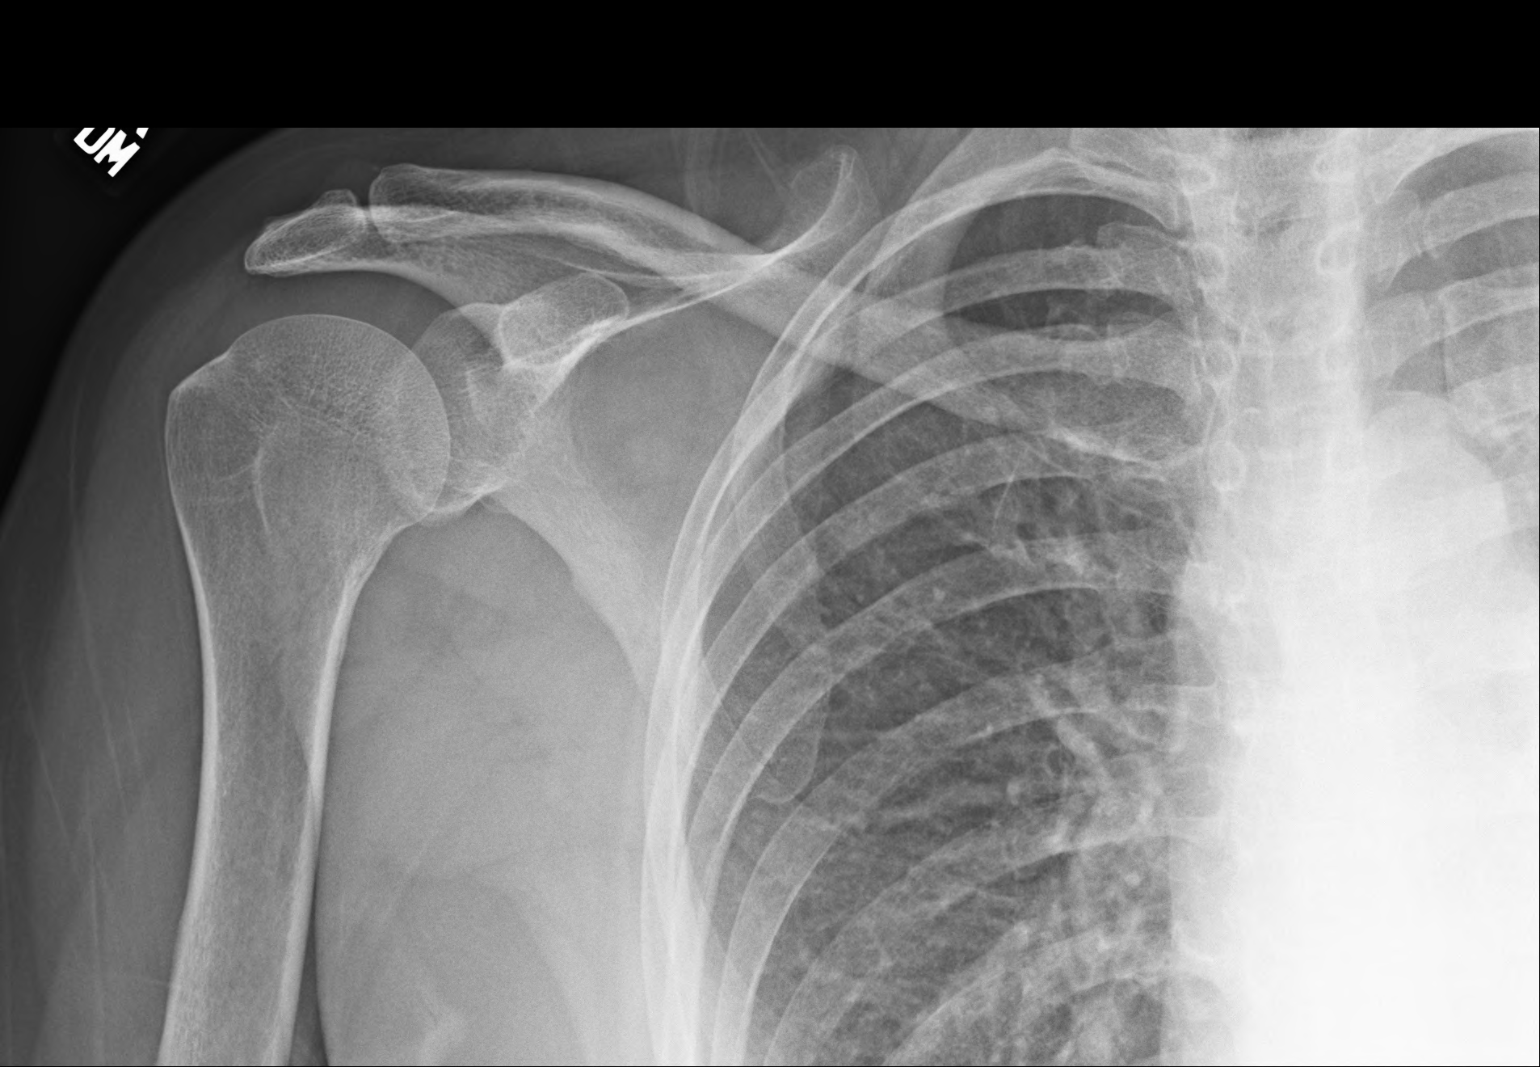
[im 3/3]
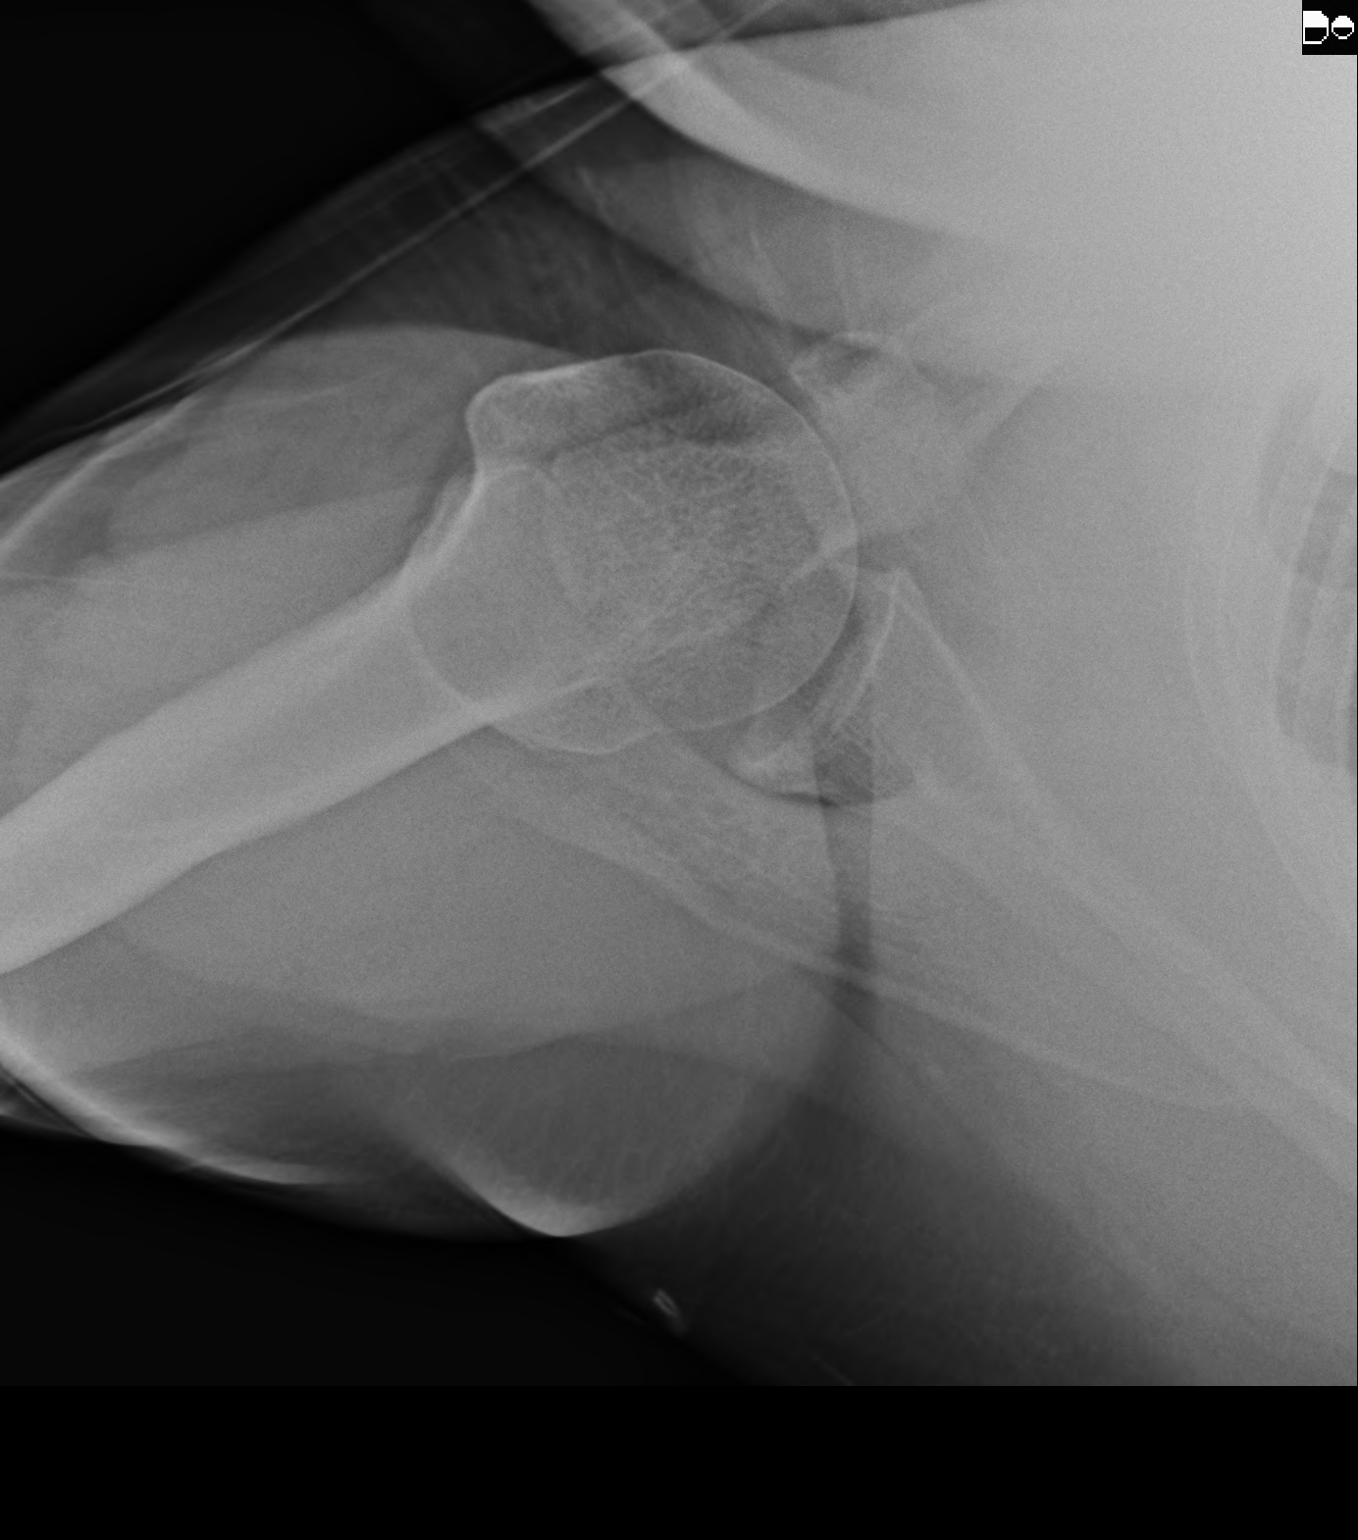

[3 of 3 positions shown; findings below may reference images not displayed]

FINDINGS: No acute fracture. No dislocation. Joint spaces are intact.
IMPRESSION: Unremarkable right shoulder radiographs.

## 2021-04-29 IMAGING — MR MRI CSPINE WO CONTRAST
6 series · 48 of 48 positions shown · non-contrast
Comparison: None

HISTORY: 46 years-old Female with Neck pain.
TECHNIQUE: Multiplanar, multisequential MRI of the cervical spine without intravenous contrast was performed.

[Series 1: bSSFP · axial · 10.0mm · 1.17mm/px · z∈[-60,+153]mm · 5 of 14 slices shown]
[im 1/14]
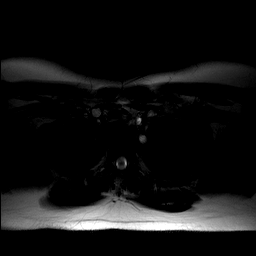
[im 4/14]
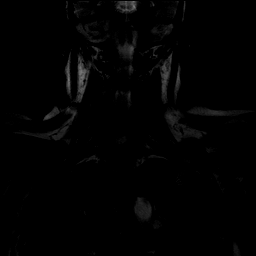
[im 7/14]
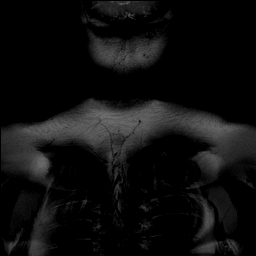
[im 10/14]
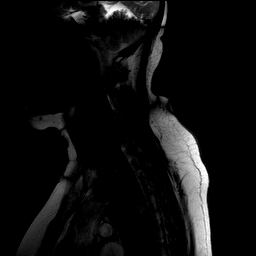
[im 14/14]
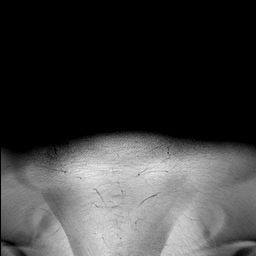

[Series 2: t2_sag · sagittal · 3.0mm · 0.69mm/px · 7 of 17 slices shown]
[im 1/17]
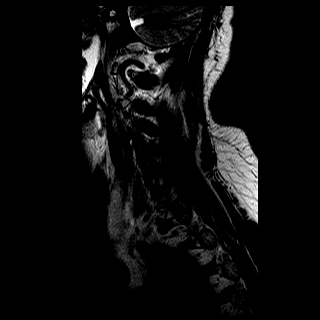
[im 3/17]
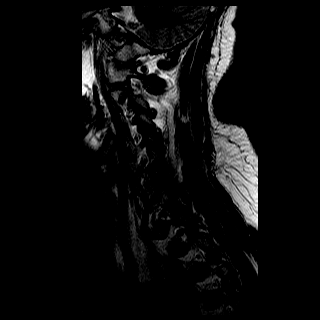
[im 6/17]
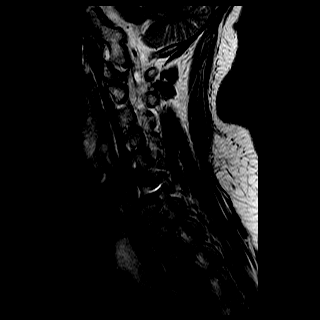
[im 9/17]
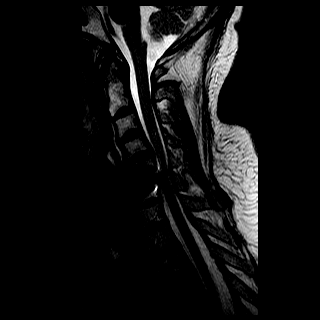
[im 11/17]
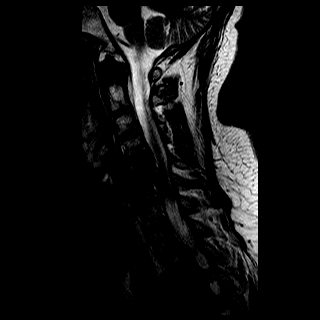
[im 14/17]
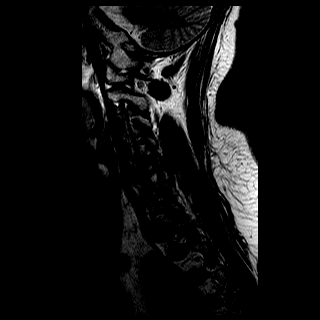
[im 17/17]
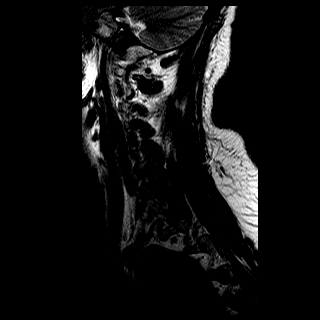

[Series 3: t1_sag · sagittal · 3.0mm · 0.86mm/px · 7 of 17 slices shown]
[im 1/17]
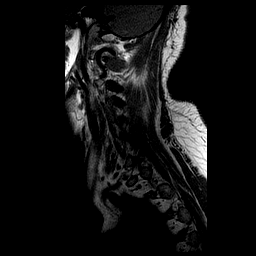
[im 3/17]
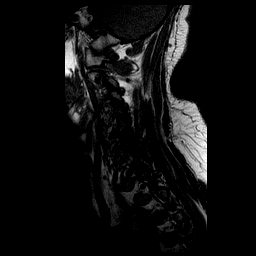
[im 6/17]
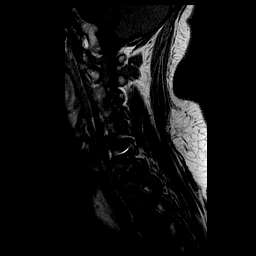
[im 9/17]
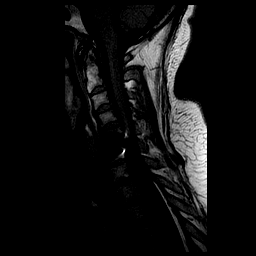
[im 11/17]
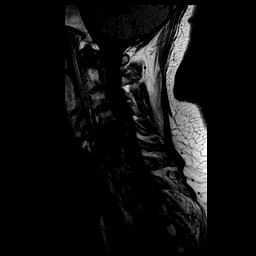
[im 14/17]
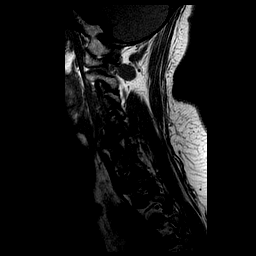
[im 17/17]
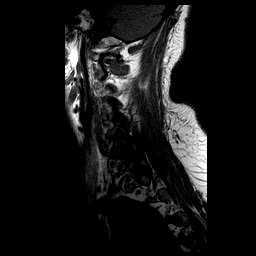

[Series 4: ir_sag · sagittal · 3.0mm · 0.86mm/px · 7 of 17 slices shown]
[im 1/17]
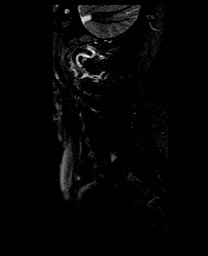
[im 3/17]
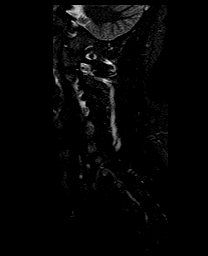
[im 6/17]
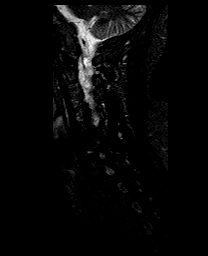
[im 9/17]
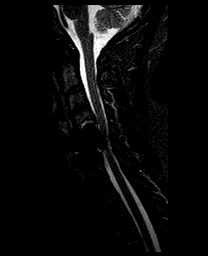
[im 11/17]
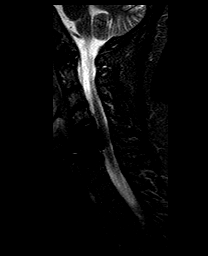
[im 14/17]
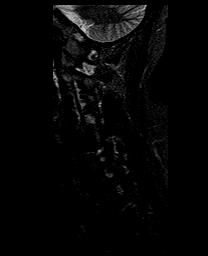
[im 17/17]
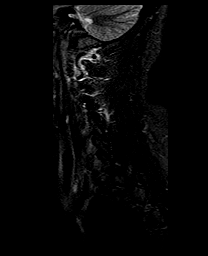

[Series 5: t2_axial · axial · 3.0mm · 0.52mm/px · z∈[-65,+35]mm · 11 of 28 slices shown]
[im 1/28]
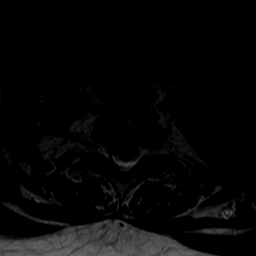
[im 3/28]
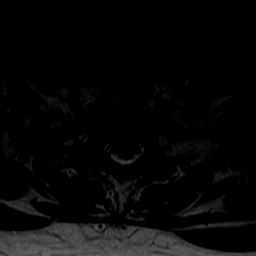
[im 6/28]
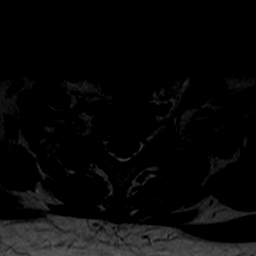
[im 9/28]
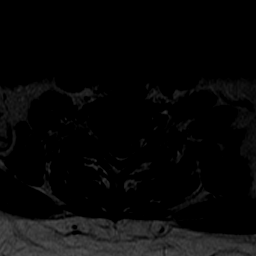
[im 11/28]
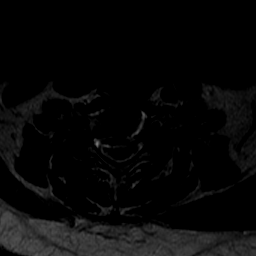
[im 14/28]
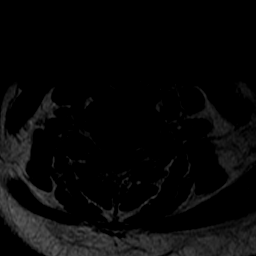
[im 17/28]
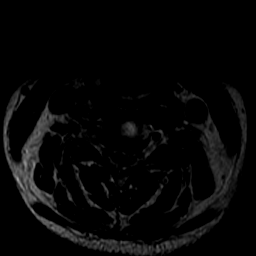
[im 19/28]
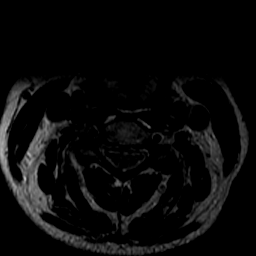
[im 22/28]
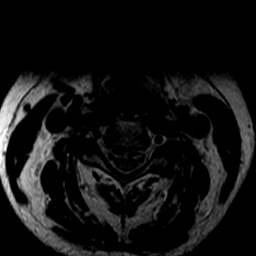
[im 25/28]
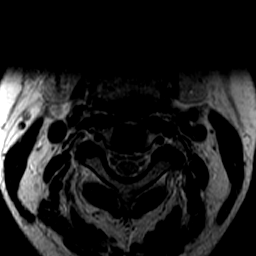
[im 28/28]
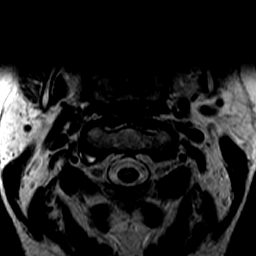

[Series 6: t2_medic_axial · axial · 3.0mm · 0.47mm/px · z∈[-63,+37]mm · 11 of 28 slices shown]
[im 1/28]
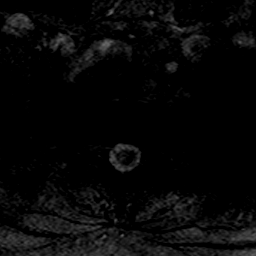
[im 3/28]
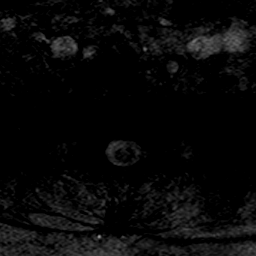
[im 6/28]
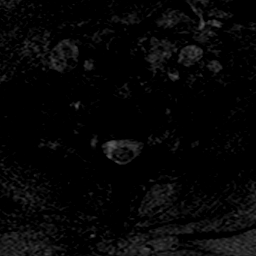
[im 9/28]
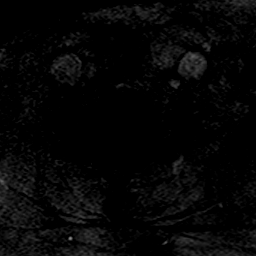
[im 11/28]
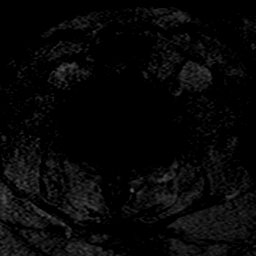
[im 14/28]
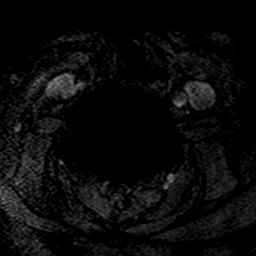
[im 17/28]
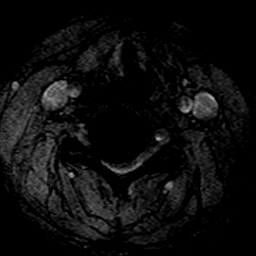
[im 19/28]
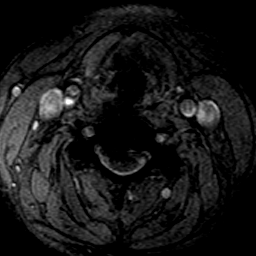
[im 22/28]
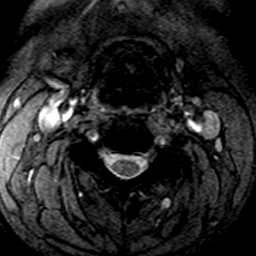
[im 25/28]
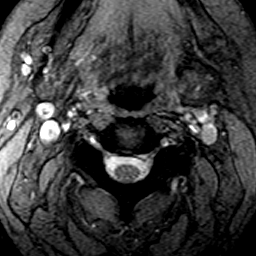
[im 28/28]
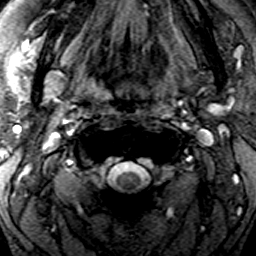

[48 of 48 positions shown; findings below may reference images not displayed]

FINDINGS: Postsurgical changes: Susceptibility from interbody fusion at C5-C6.

Alignment: Mild reversal of the cervical lordosis centered at C5.  

Vertebrae and discs: Mild multilevel degenerative disc disease and discogenic endplate change. 

Marrow: No suspicious osseous lesion or acute fracture.

Cord: The spinal cord is normal in signal. 

C2-3: Mild to moderate facet arthrosis. No significant canal stenosis. Mild bilateral neural foraminal stenosis.

C3-4: Mild to moderate facet arthrosis. No significant canal or neural foraminal stenosis.

C4-5: Mild disc osteophyte complex. Small left paracentral disc protrusion. Mild facet arthrosis. Mild to moderate canal stenosis with mild flattening of the left frontal cord. No significant neural foraminal stenosis.

C5-6: Postsurgical level. Artifact limits evaluation. Mild facet arthrosis. No high-grade canal or neural foraminal stenosis.

C6-7: Mild asymmetric right disc osteophyte complex. Mild to moderate facet arthrosis. Mild canal and moderate right neural foraminal stenosis.

C7-T1: Minimal disc osteophyte complex. Mild to moderate facet arthrosis. No significant canal or neural foraminal stenosis.

The paravertebral soft tissues are unremarkable.
IMPRESSION: 1.
Postsurgical changes from interbody fusion at C5-C6.

2.
Mild to moderate canal stenosis at C4-C5 secondary to a small left paracentral disc protrusion. Mild flattening of the left ventral cord. No abnormal cord signal.

3.
Moderate right neural foraminal stenosis at C6-C7 secondary to an asymmetric right disc osteophyte complex.

## 2021-04-29 IMAGING — MR MRI SHOULDER RT WO CONTRAST
4 of 5 series · 33 of 40 positions shown · non-contrast
Comparison: Radiographs dated 04/16/2021.

INDICATION: Pain in right shoulder and neck.
TECHNIQUE: Multiplanar, multiecho imaging of the right shoulder was performed, including T1-weighted and fluid sensitive sequences without intravenous contrast.

[Series 3: t2_axial_fs · axial · 4.0mm · 0.64mm/px · z∈[-21,+69]mm · 8 of 21 slices shown]
[im 1/21]
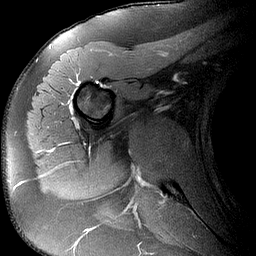
[im 3/21]
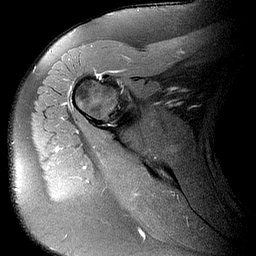
[im 6/21]
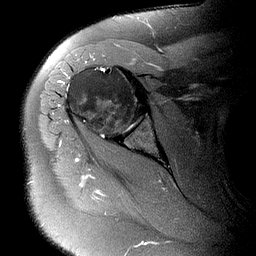
[im 9/21]
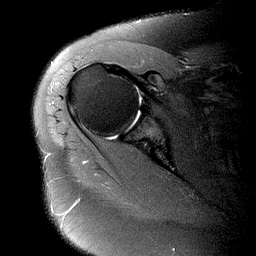
[im 12/21]
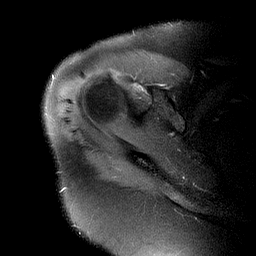
[im 15/21]
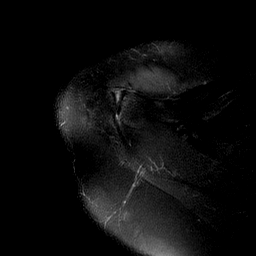
[im 18/21]
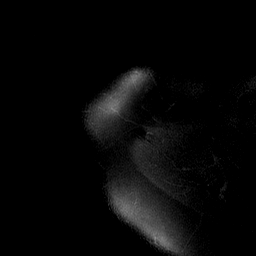
[im 21/21]
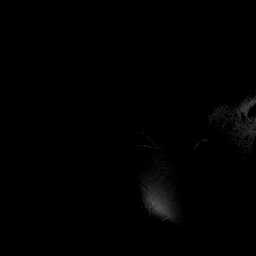

[Series 4: t2_sag_fs · oblique · 4.0mm · 0.44mm/px · 9 of 24 slices shown]
[im 1/24]
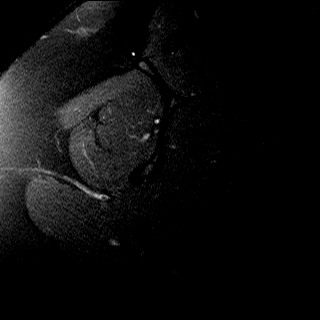
[im 3/24]
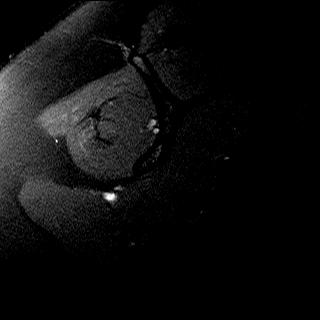
[im 6/24]
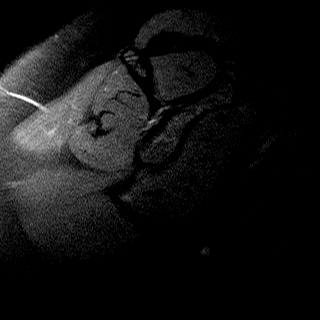
[im 9/24]
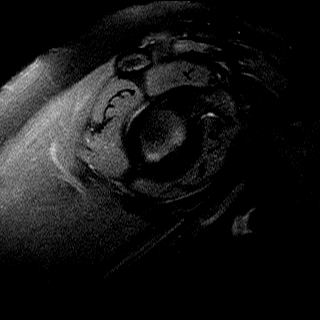
[im 12/24]
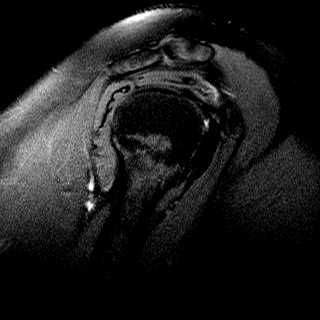
[im 15/24]
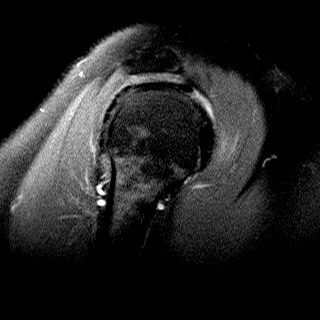
[im 18/24]
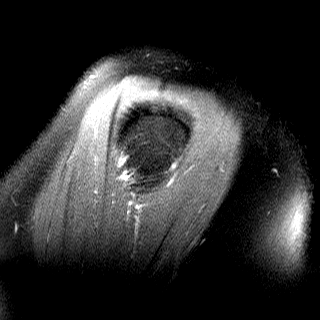
[im 21/24]
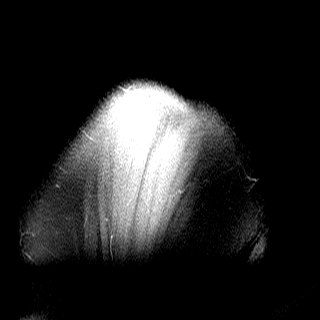
[im 24/24]
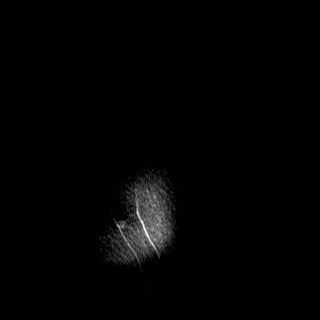

[Series 5: t1_sag · oblique · 4.0mm · 0.44mm/px · 9 of 24 slices shown]
[im 1/24]
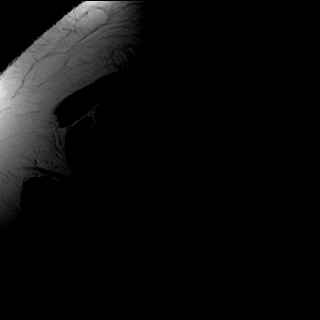
[im 3/24]
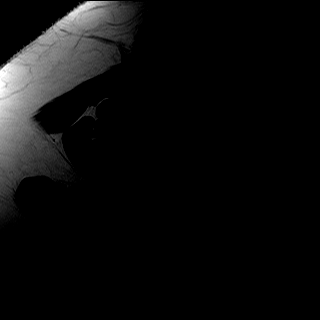
[im 6/24]
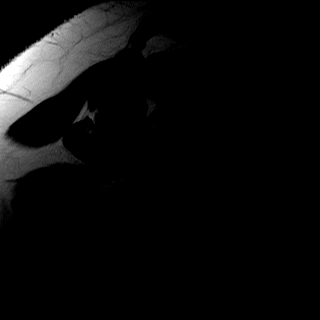
[im 9/24]
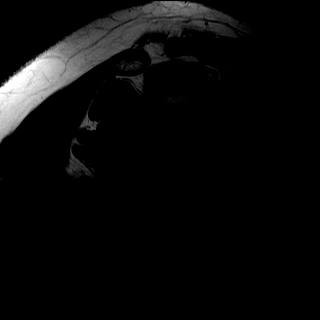
[im 12/24]
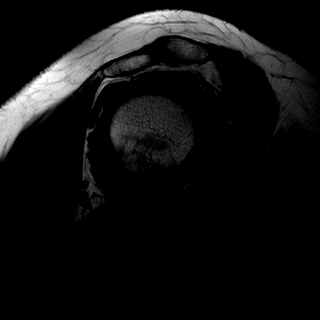
[im 15/24]
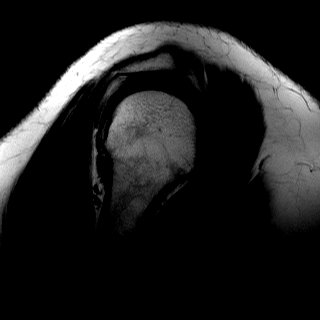
[im 18/24]
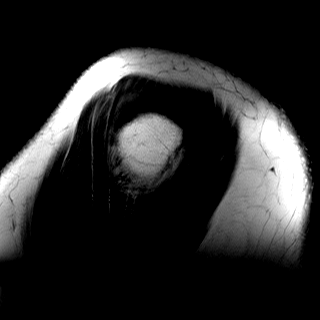
[im 21/24]
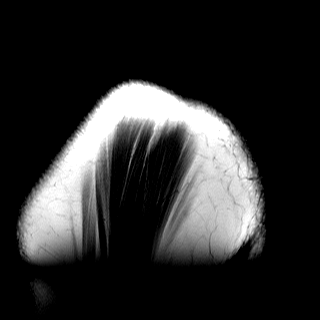
[im 24/24]
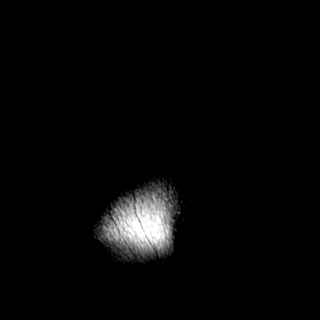

[Series 6: t2_cor_obl_fs · oblique · 4.0mm · 0.29mm/px · 7 of 20 slices shown]
[im 1/20]
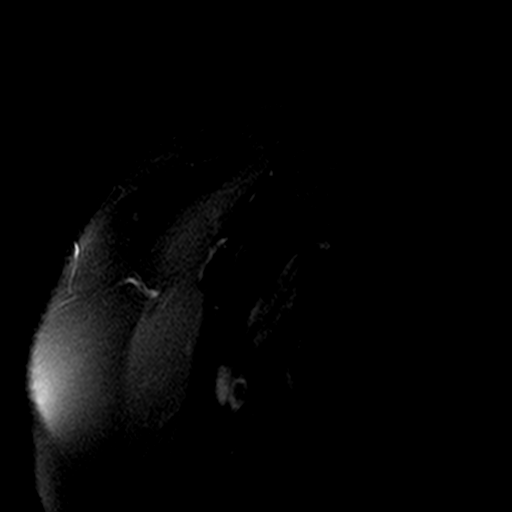
[im 4/20]
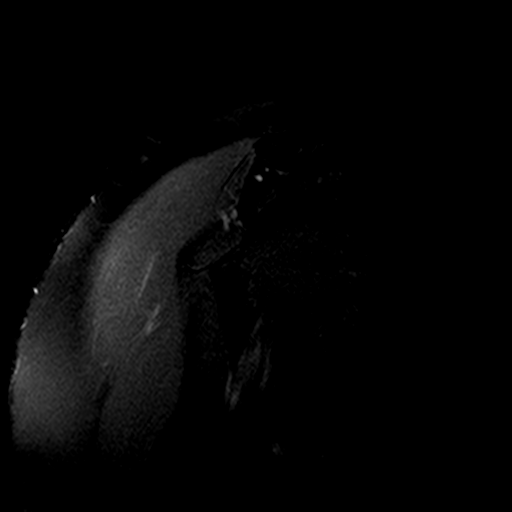
[im 7/20]
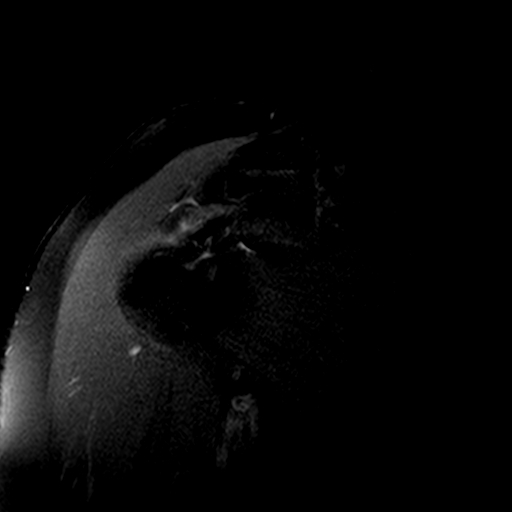
[im 10/20]
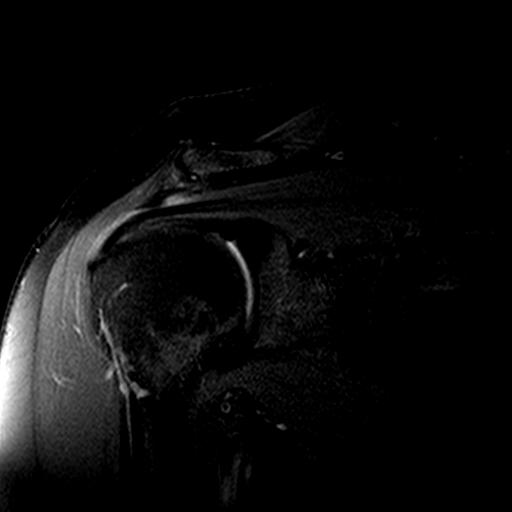
[im 13/20]
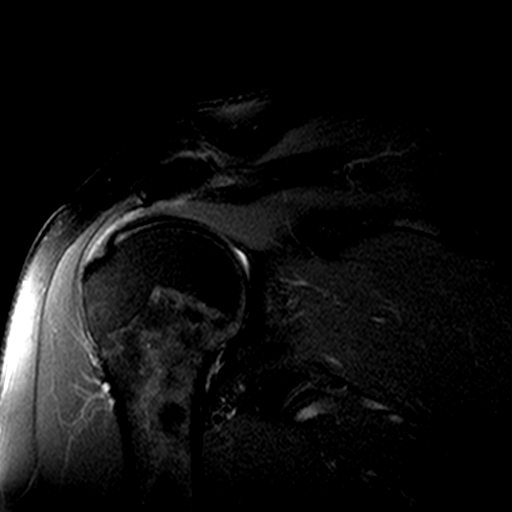
[im 16/20]
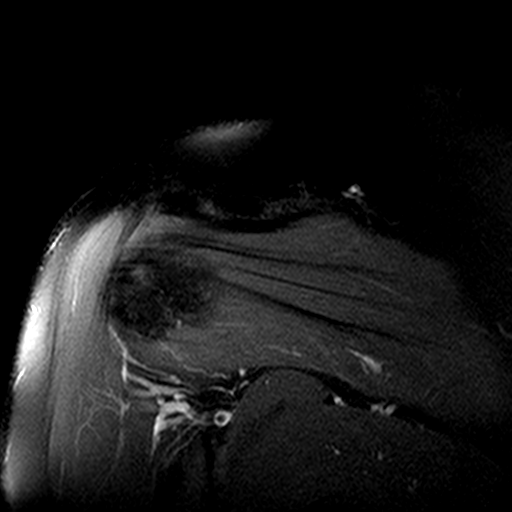
[im 20/20]
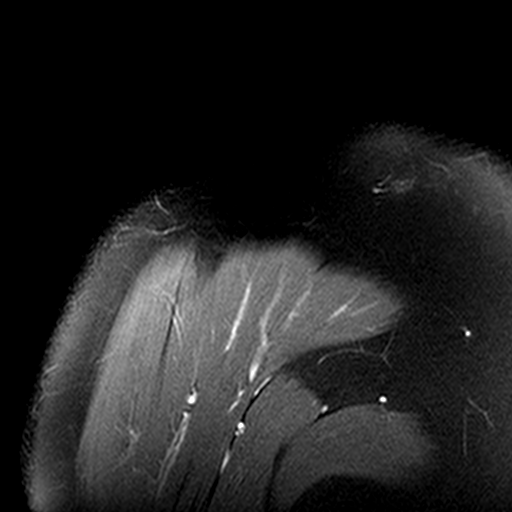

[33 of 40 positions shown; findings below may reference images not displayed]

FINDINGS: Rotator cuff:

Supraspinatus and infraspinatus: Mild insertional tendinosis of both the supraspinatus and infraspinatus. No visible full or partial-thickness tear.

Subscapularis: Intact.

Teres minor: Intact. 

Cuff muscles: Normal in size and signal intensity.  No fatty infiltration.

Acromioclavicular joint: Mild DJD. Downsloping acromion. Correlate for impingement.

JIM bursa: Small amount of fluid, likely relating to bursitis.

Long head biceps tendon: Intact, with normal course.

Rotator Interval: No scar or obliteration of fat.

Labrum: Intact.

Cartilage: Intact.

Marrow: Within normal limits.

No cystic or solid masses. No visible adenopathy.
IMPRESSION: 1.
Mild insertional tendinosis of both the supraspinatus and infraspinatus. No visible focal or partial-thickness tear.

2.
Mild AC joint DJD. Downsloping acromion. Correlate for impingement.

3.
Mild subacromial-subdeltoid bursitis.
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
## 2021-12-22 IMAGING — CR C-SPINE 4 or 5 views
1 series · 6 of 6 positions shown · non-contrast
Comparison: None.

Images Obtained from [facility]
REASON FOR EXAM: Cervical displacement.

[Series 1: extension · 0.17mm/px · 6 of 6 slices shown]
[im 1/6]
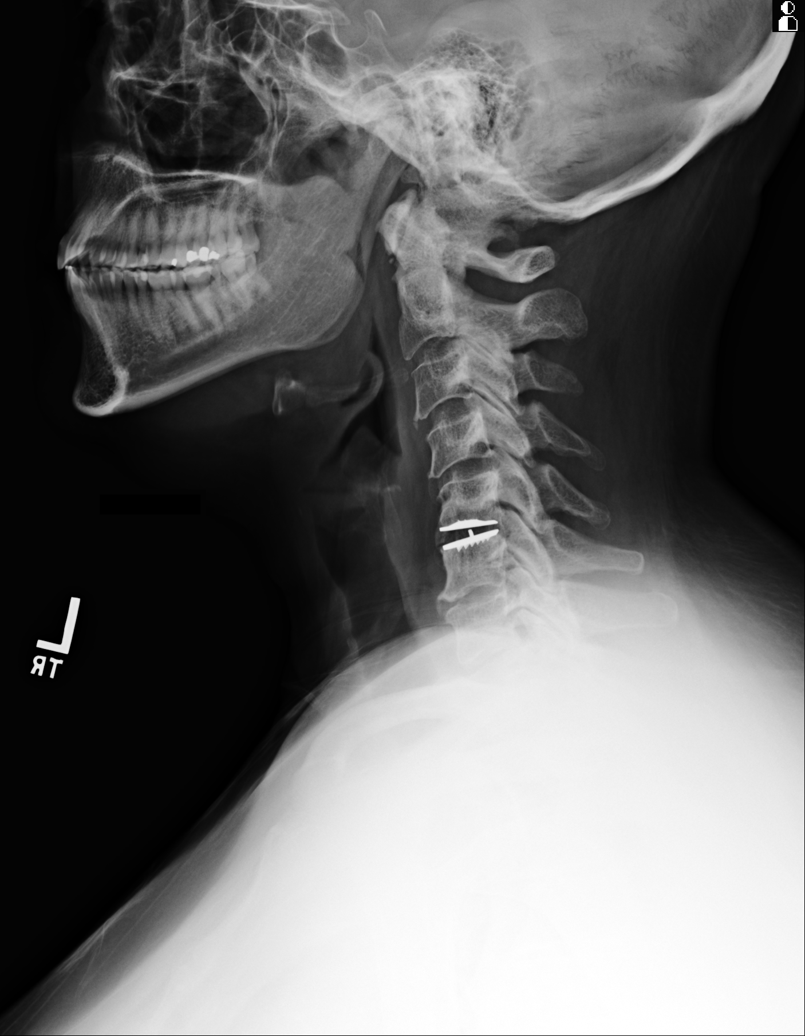
[im 2/6]
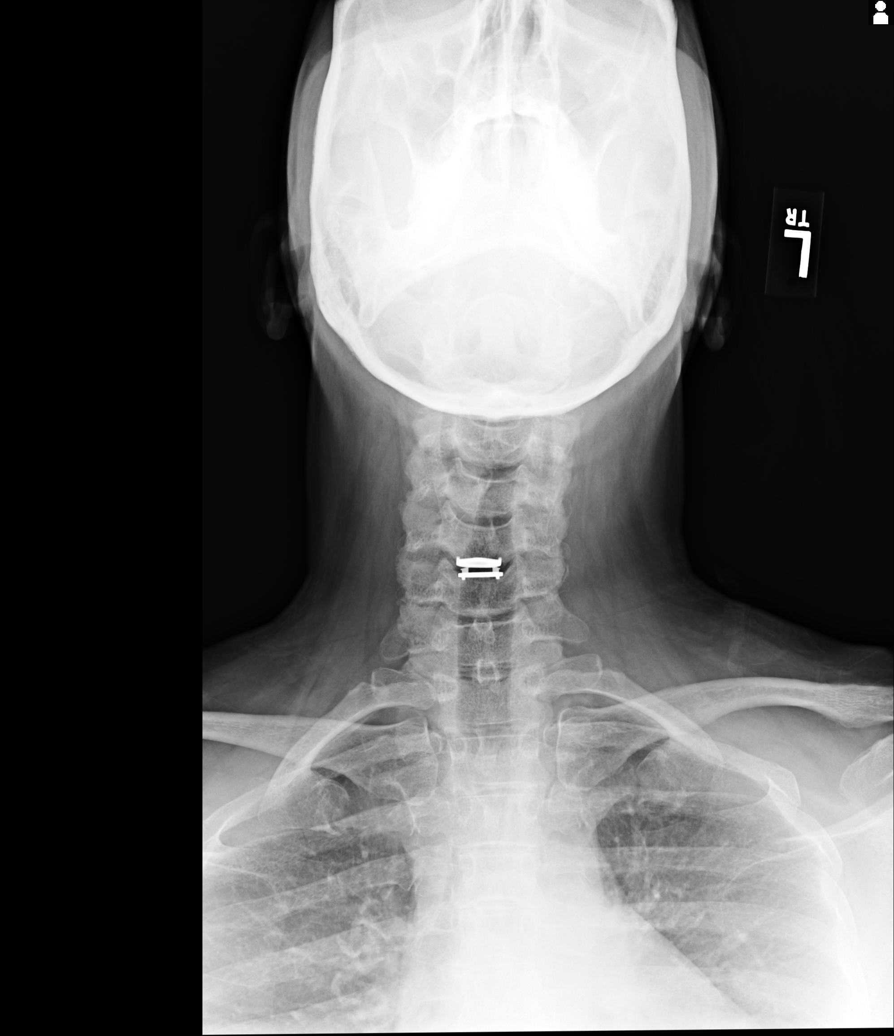
[im 3/6]
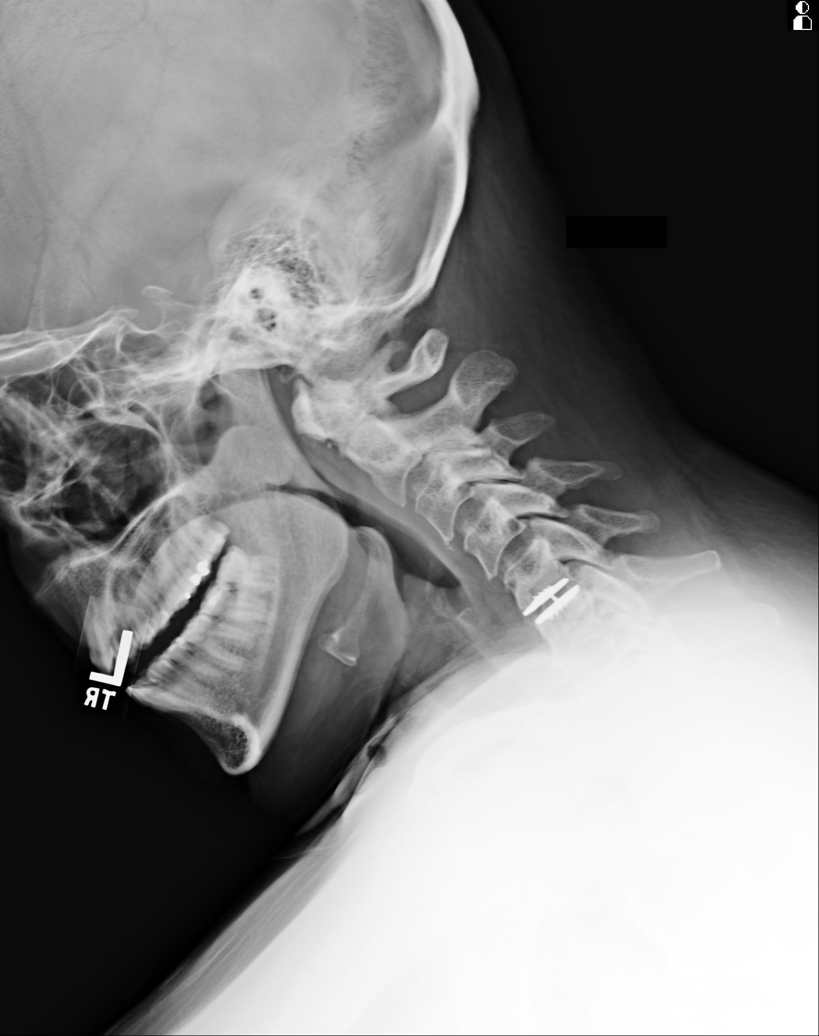
[im 4/6]
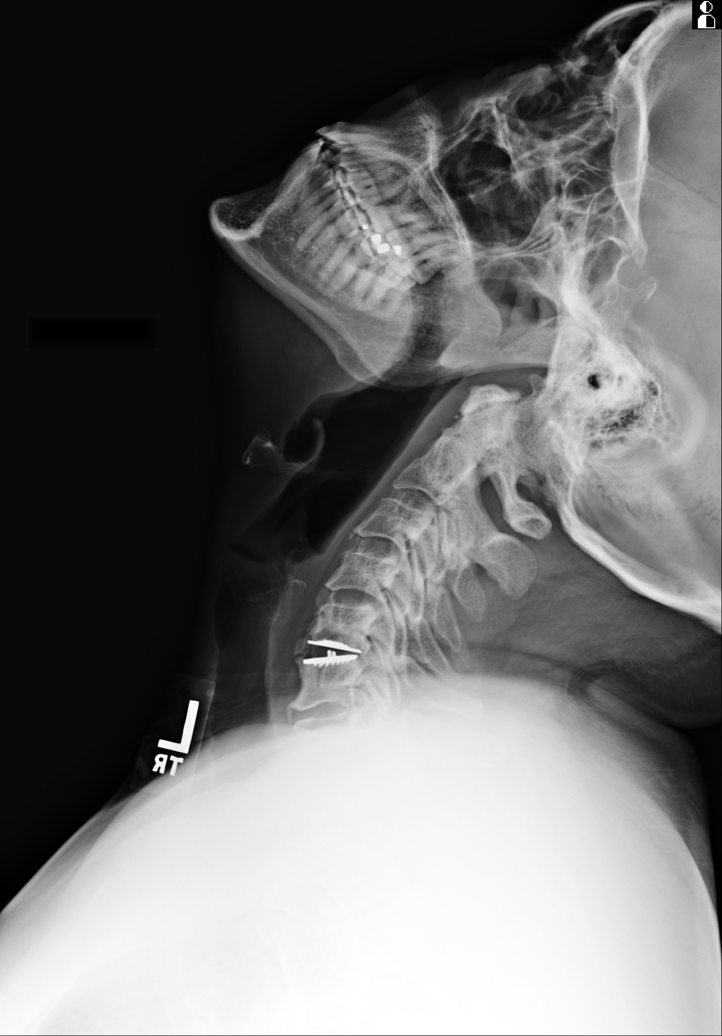
[im 5/6]
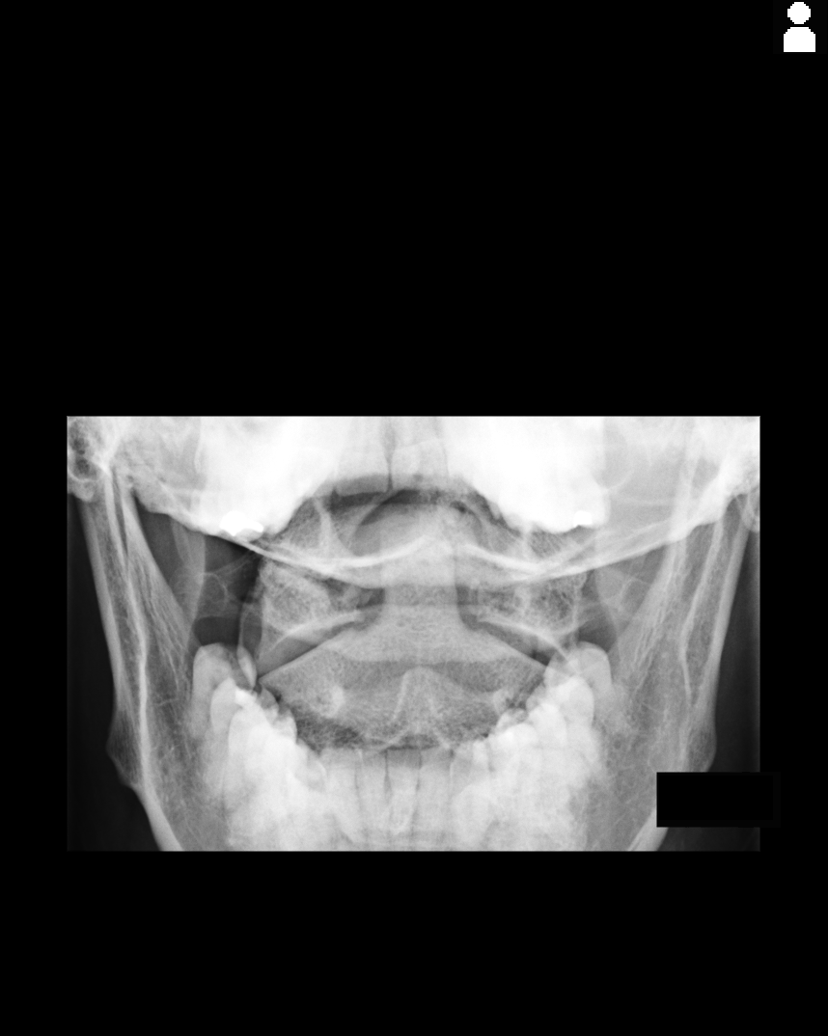
[im 6/6]
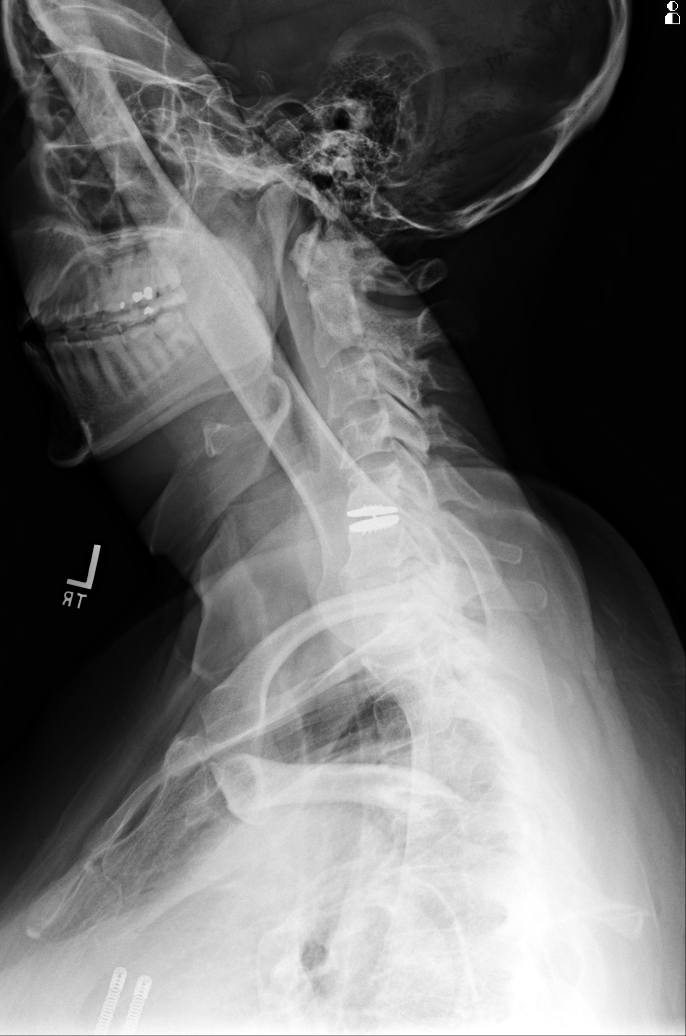

[6 of 6 positions shown; findings below may reference images not displayed]

TECHNIQUE AND FINDINGS:  6 views of the cervical spine were obtained including flexion and extension lateral views. Postsurgical changes of disc spacer at C5-C6 are noted. There is slight cervical
kyphosis. There is normal height to the vertebral bodies. Disc spaces show the C5-C6 postsurgical changes with appropriate position of the implant. There is disc narrowing with anterior osteophyte
formation seen at C6-C7. Facets are in good alignment. Flexion and extension views do not show any evidence of instability. With extension the posterior C5-C6 disc space narrows and the anterior disc
space increases. Prevertebral soft tissues are normal. Dens is intact.
IMPRESSION: 1.  Postsurgical changes at C5-C6.
2.  Degenerative disc disease at C6-C7.

## 2021-12-22 IMAGING — MG MAMMO SCRN BIL W/CAD TOMO
8 series · 8 of 24 positions shown · non-contrast
Comparison: The present examination has been compared to prior imaging studies.

Images Obtained from [facility]
INDICATION: Screening.
TECHNIQUE: Bilateral 2-D digital screening mammogram was performed followed by 3-D tomosynthesis.  Current study was also evaluated with a computer aided detection (CAD) system.
MAMMOGRAM FINDINGS:
There are scattered areas of fibroglandular density.
No suspicious abnormality is seen in either breast.

[R MLO]
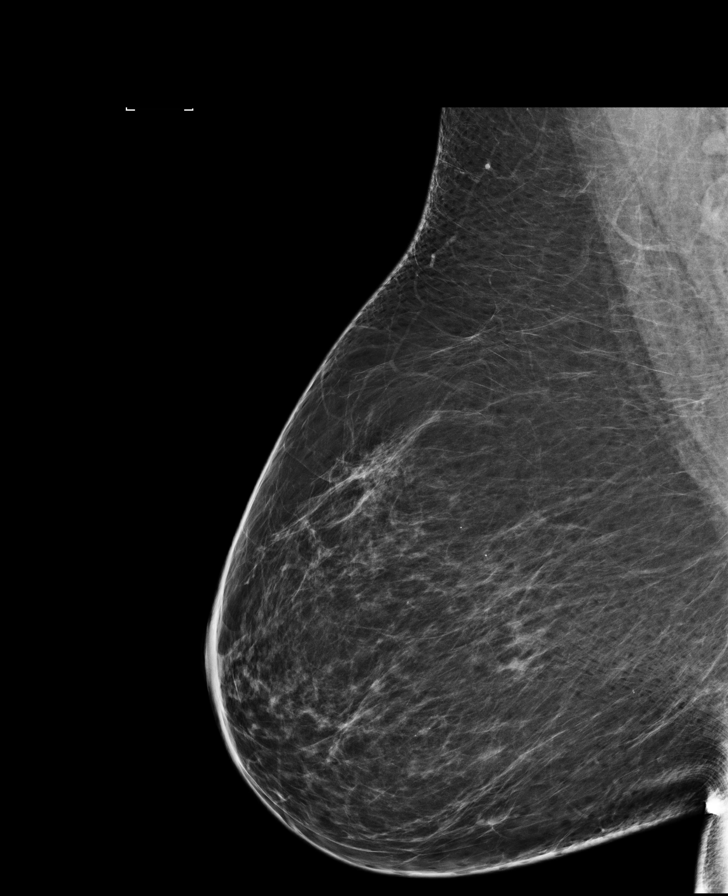

[R CC]
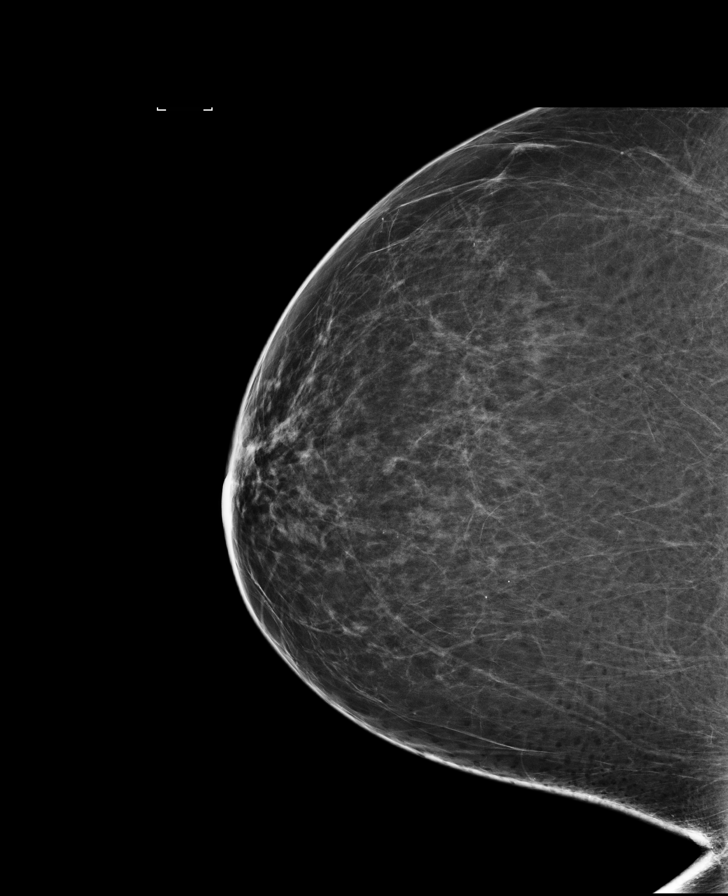

[L MLO]
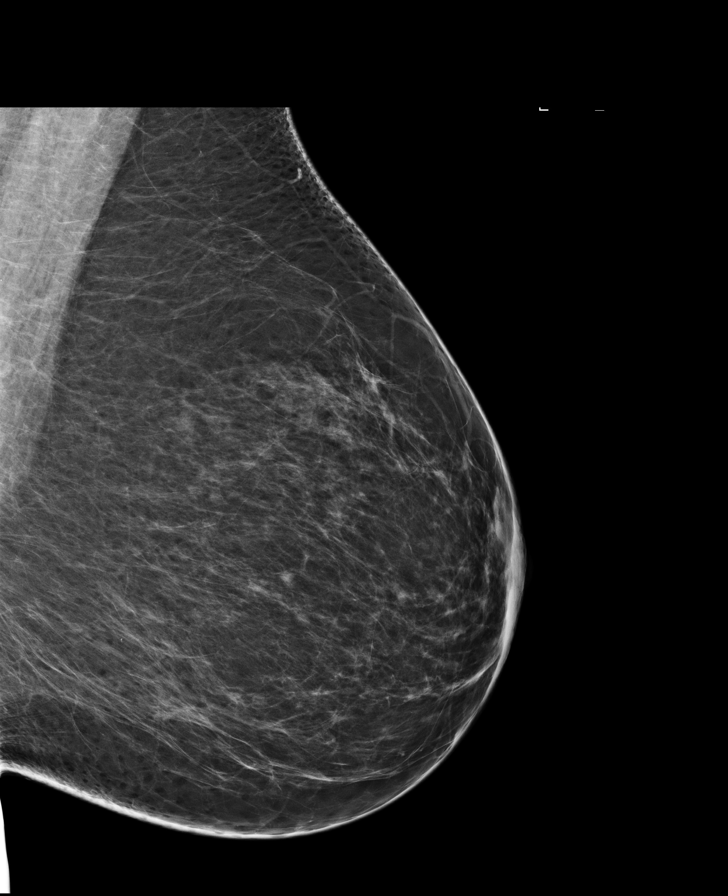

[L CC]
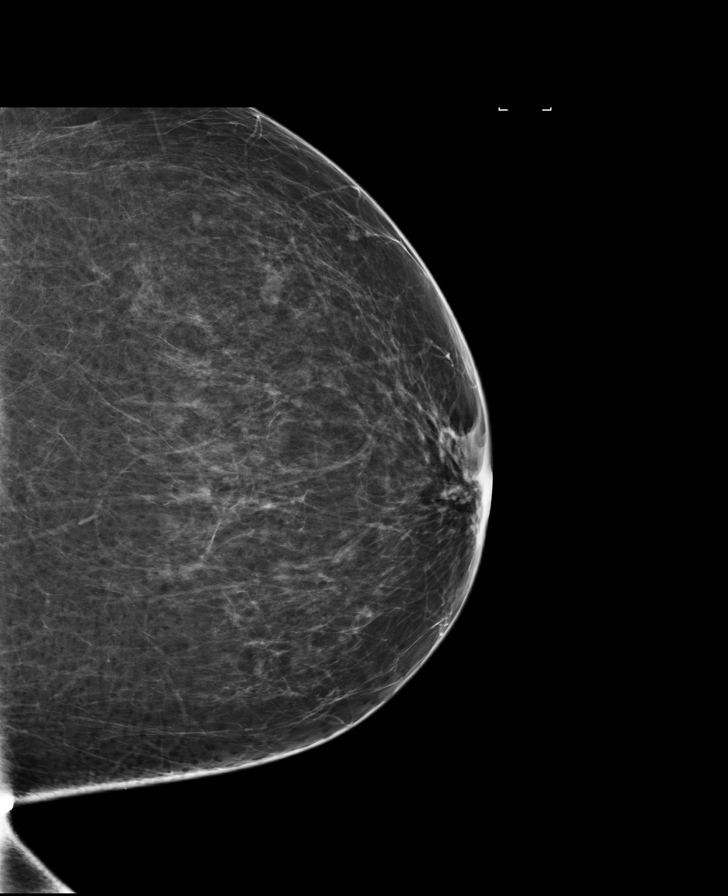

[L MLO tomo · tomo slice 46/91.0]
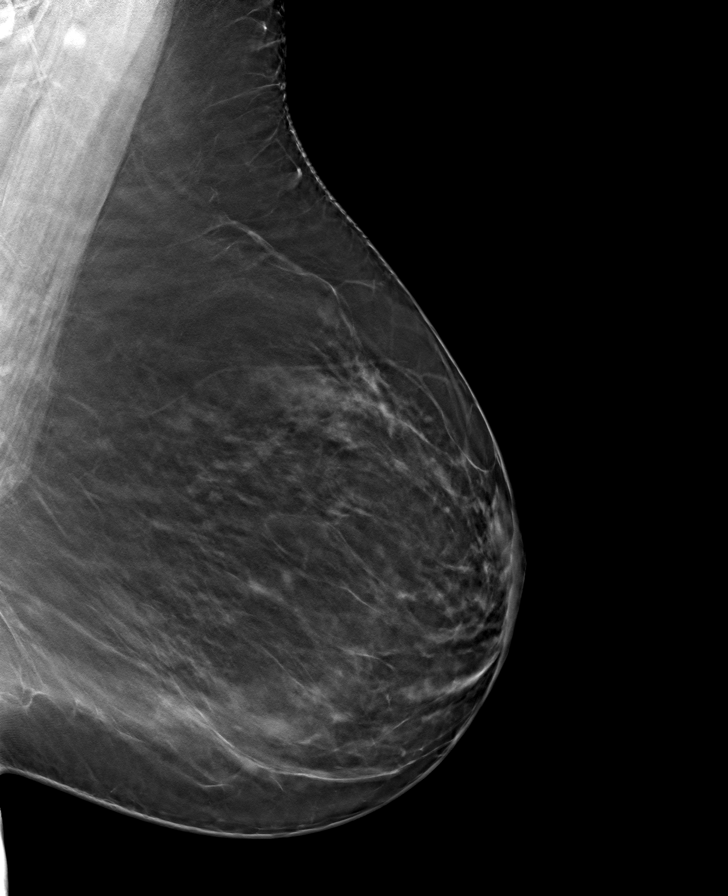

[R MLO tomo · tomo slice 47/94.0]
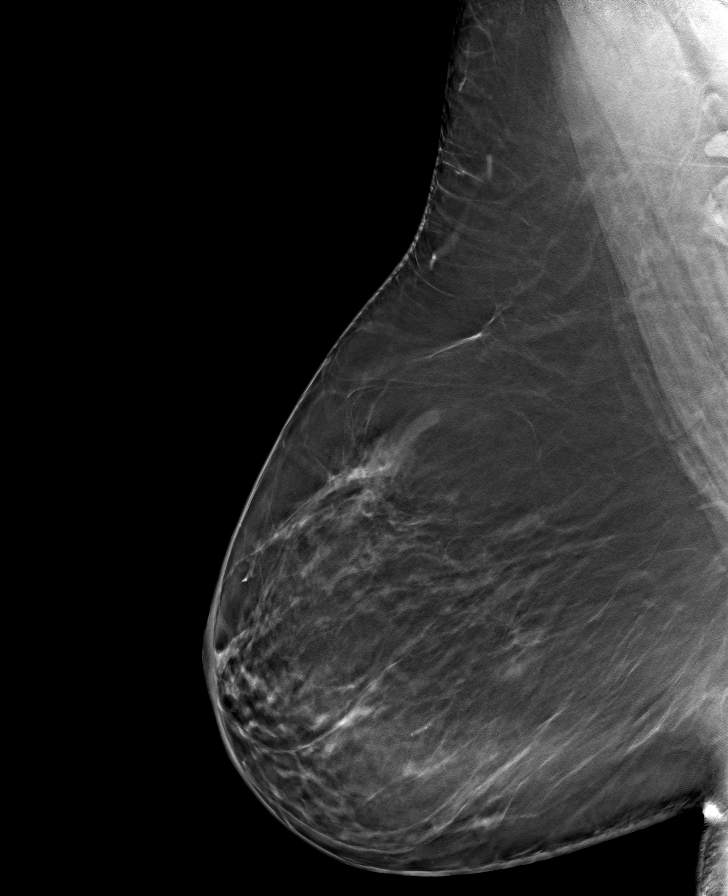

[L CC tomo · tomo slice 41/80.0]
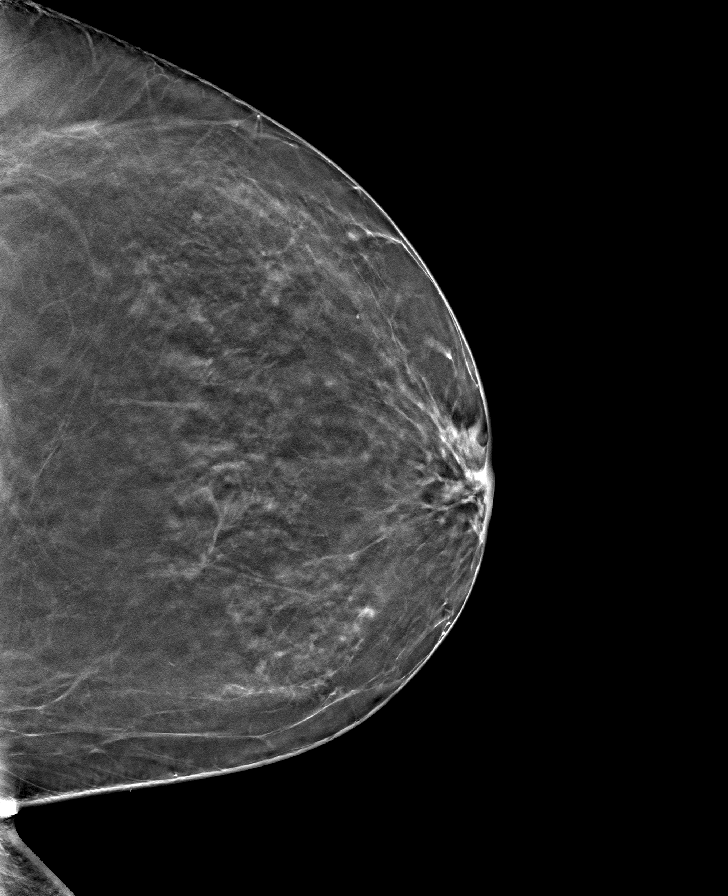

[R CC tomo · tomo slice 38/75.0]
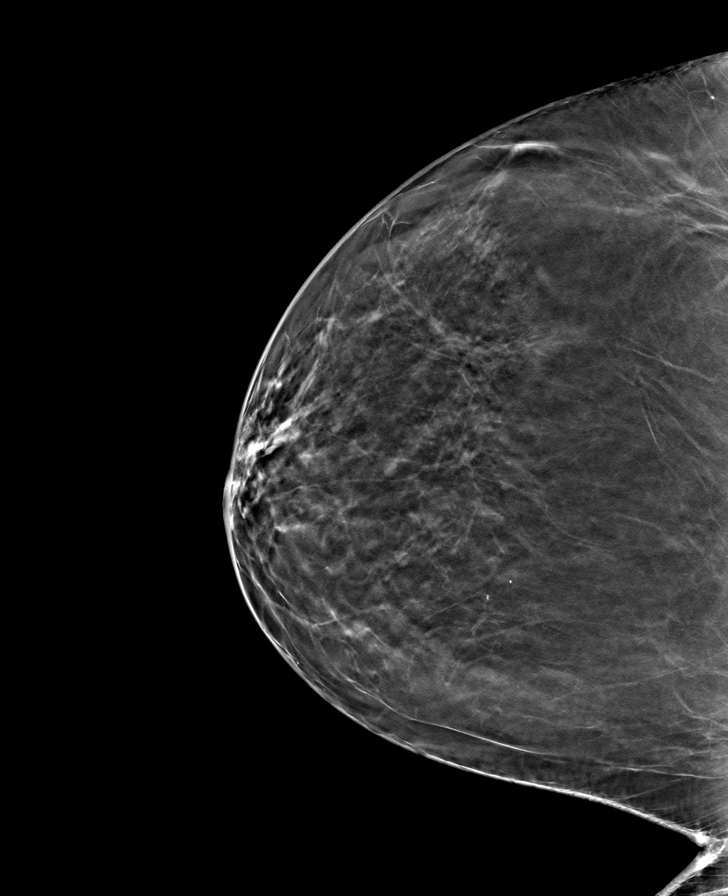

[8 of 24 positions shown; findings below may reference images not displayed]

IMPRESSION: There is no mammographic evidence of malignancy.
Screening mammogram recommended in 1 year.
BI-RADS Category 1: Negative
